# Patient Record
Sex: Female | Born: 1960 | Race: White | Hispanic: Yes | Marital: Single | Smoking: Never smoker
Health system: Southern US, Community
[De-identification: ages and names within clinical notes are randomized; demographics above are authoritative.]

## PROBLEM LIST (undated history)

## (undated) ENCOUNTER — Inpatient Hospital Stay (HOSPITAL_COMMUNITY): Payer: BLUE CROSS/BLUE SHIELD

## (undated) DIAGNOSIS — Z789 Other specified health status: Secondary | ICD-10-CM

## (undated) DIAGNOSIS — R51 Headache: Secondary | ICD-10-CM

## (undated) DIAGNOSIS — R233 Spontaneous ecchymoses: Secondary | ICD-10-CM

## (undated) DIAGNOSIS — E079 Disorder of thyroid, unspecified: Secondary | ICD-10-CM

## (undated) DIAGNOSIS — E039 Hypothyroidism, unspecified: Secondary | ICD-10-CM

## (undated) DIAGNOSIS — T4145XA Adverse effect of unspecified anesthetic, initial encounter: Secondary | ICD-10-CM

## (undated) DIAGNOSIS — R238 Other skin changes: Secondary | ICD-10-CM

## (undated) HISTORY — DX: Disorder of thyroid, unspecified: E07.9

---

## 1995-02-02 DIAGNOSIS — T8859XA Other complications of anesthesia, initial encounter: Secondary | ICD-10-CM

## 1995-02-02 HISTORY — DX: Other complications of anesthesia, initial encounter: T88.59XA

## 2001-10-10 ENCOUNTER — Encounter: Payer: Self-pay | Admitting: Family Medicine

## 2001-10-10 ENCOUNTER — Encounter: Admission: RE | Admit: 2001-10-10 | Discharge: 2001-10-10 | Payer: Self-pay | Admitting: Family Medicine

## 2003-06-14 ENCOUNTER — Inpatient Hospital Stay (HOSPITAL_COMMUNITY): Admission: AD | Admit: 2003-06-14 | Discharge: 2003-06-14 | Payer: Self-pay | Admitting: *Deleted

## 2003-06-27 ENCOUNTER — Encounter: Admission: RE | Admit: 2003-06-27 | Discharge: 2003-06-27 | Payer: Self-pay | Admitting: Obstetrics and Gynecology

## 2003-09-26 ENCOUNTER — Encounter: Admission: RE | Admit: 2003-09-26 | Discharge: 2003-09-26 | Payer: Self-pay | Admitting: Obstetrics and Gynecology

## 2003-10-01 ENCOUNTER — Ambulatory Visit (HOSPITAL_COMMUNITY): Admission: RE | Admit: 2003-10-01 | Discharge: 2003-10-01 | Payer: Self-pay | Admitting: *Deleted

## 2004-10-01 ENCOUNTER — Ambulatory Visit (HOSPITAL_COMMUNITY): Admission: RE | Admit: 2004-10-01 | Discharge: 2004-10-01 | Payer: Self-pay | Admitting: *Deleted

## 2004-12-17 ENCOUNTER — Ambulatory Visit: Payer: Self-pay | Admitting: *Deleted

## 2004-12-17 ENCOUNTER — Encounter (INDEPENDENT_AMBULATORY_CARE_PROVIDER_SITE_OTHER): Payer: Self-pay | Admitting: *Deleted

## 2005-06-01 ENCOUNTER — Emergency Department (HOSPITAL_COMMUNITY): Admission: EM | Admit: 2005-06-01 | Discharge: 2005-06-01 | Payer: Self-pay | Admitting: Emergency Medicine

## 2005-06-17 ENCOUNTER — Ambulatory Visit: Payer: Self-pay | Admitting: Nurse Practitioner

## 2005-06-17 ENCOUNTER — Ambulatory Visit: Payer: Self-pay | Admitting: *Deleted

## 2005-07-08 ENCOUNTER — Ambulatory Visit: Payer: Self-pay | Admitting: Nurse Practitioner

## 2005-08-02 ENCOUNTER — Ambulatory Visit: Payer: Self-pay | Admitting: Nurse Practitioner

## 2005-09-27 ENCOUNTER — Ambulatory Visit: Payer: Self-pay | Admitting: Family Medicine

## 2005-10-05 ENCOUNTER — Ambulatory Visit (HOSPITAL_COMMUNITY): Admission: RE | Admit: 2005-10-05 | Discharge: 2005-10-05 | Payer: Self-pay | Admitting: Obstetrics & Gynecology

## 2005-10-05 ENCOUNTER — Ambulatory Visit: Payer: Self-pay | Admitting: Internal Medicine

## 2005-11-03 ENCOUNTER — Ambulatory Visit: Payer: Self-pay | Admitting: Nurse Practitioner

## 2005-11-11 ENCOUNTER — Ambulatory Visit: Payer: Self-pay | Admitting: Nurse Practitioner

## 2005-12-13 ENCOUNTER — Ambulatory Visit: Payer: Self-pay | Admitting: Nurse Practitioner

## 2006-01-14 ENCOUNTER — Ambulatory Visit: Payer: Self-pay | Admitting: Nurse Practitioner

## 2006-01-14 ENCOUNTER — Encounter (INDEPENDENT_AMBULATORY_CARE_PROVIDER_SITE_OTHER): Payer: Self-pay | Admitting: Nurse Practitioner

## 2006-03-07 ENCOUNTER — Ambulatory Visit: Payer: Self-pay | Admitting: Internal Medicine

## 2006-06-09 ENCOUNTER — Ambulatory Visit: Payer: Self-pay | Admitting: Nurse Practitioner

## 2006-06-20 ENCOUNTER — Ambulatory Visit: Payer: Self-pay | Admitting: Nurse Practitioner

## 2006-06-28 ENCOUNTER — Ambulatory Visit: Payer: Self-pay | Admitting: Nurse Practitioner

## 2006-07-05 ENCOUNTER — Ambulatory Visit: Payer: Self-pay | Admitting: Internal Medicine

## 2006-09-07 ENCOUNTER — Ambulatory Visit: Payer: Self-pay | Admitting: Internal Medicine

## 2006-09-14 ENCOUNTER — Ambulatory Visit (HOSPITAL_COMMUNITY): Admission: RE | Admit: 2006-09-14 | Discharge: 2006-09-14 | Payer: Self-pay | Admitting: Nurse Practitioner

## 2006-10-07 ENCOUNTER — Ambulatory Visit (HOSPITAL_COMMUNITY): Admission: RE | Admit: 2006-10-07 | Discharge: 2006-10-07 | Payer: Self-pay | Admitting: Family Medicine

## 2006-10-19 ENCOUNTER — Encounter (INDEPENDENT_AMBULATORY_CARE_PROVIDER_SITE_OTHER): Payer: Self-pay | Admitting: *Deleted

## 2006-11-04 ENCOUNTER — Ambulatory Visit: Payer: Self-pay | Admitting: Internal Medicine

## 2006-11-04 ENCOUNTER — Encounter (INDEPENDENT_AMBULATORY_CARE_PROVIDER_SITE_OTHER): Payer: Self-pay | Admitting: Nurse Practitioner

## 2006-11-04 LAB — CONVERTED CEMR LAB: TSH: 2.23 microintl units/mL (ref 0.350–5.50)

## 2007-07-18 ENCOUNTER — Encounter (INDEPENDENT_AMBULATORY_CARE_PROVIDER_SITE_OTHER): Payer: Self-pay | Admitting: Nurse Practitioner

## 2007-07-18 LAB — CONVERTED CEMR LAB
BUN: 18 mg/dL (ref 6–23)
Calcium: 9.2 mg/dL (ref 8.4–10.5)
HDL: 46 mg/dL (ref >39)
Sodium: 143 meq/L (ref 135–145)
TSH: 5.195 microintl units/mL (ref 0.350–5.50)
Triglycerides: 142 mg/dL (ref <150)
VLDL: 28 mg/dL (ref 0–40)

## 2007-07-19 ENCOUNTER — Ambulatory Visit: Payer: Self-pay | Admitting: Internal Medicine

## 2007-10-10 ENCOUNTER — Ambulatory Visit (HOSPITAL_COMMUNITY): Admission: RE | Admit: 2007-10-10 | Discharge: 2007-10-10 | Payer: Self-pay | Admitting: Family Medicine

## 2007-11-22 ENCOUNTER — Ambulatory Visit: Payer: Self-pay | Admitting: Internal Medicine

## 2007-11-22 ENCOUNTER — Encounter (INDEPENDENT_AMBULATORY_CARE_PROVIDER_SITE_OTHER): Payer: Self-pay | Admitting: Internal Medicine

## 2007-11-22 LAB — CONVERTED CEMR LAB
ALT: 15 units/L (ref 0–35)
AST: 24 units/L (ref 0–37)
Alkaline Phosphatase: 83 units/L (ref 39–117)
Creatinine, Ser: 0.69 mg/dL (ref 0.40–1.20)
Eosinophils Relative: 2 % (ref 0–5)
HCT: 42.2 % (ref 36.0–46.0)
HDL: 46 mg/dL (ref >39)
LDL Cholesterol: 121 mg/dL — ABNORMAL HIGH (ref 0–99)
MCV: 87.7 fL (ref 78.0–100.0)
Monocytes Relative: 6 % (ref 3–12)
Platelets: 236 10*3/uL (ref 150–400)
Potassium: 4.3 meq/L (ref 3.5–5.3)
RBC: 4.81 M/uL (ref 3.87–5.11)
RDW: 13.7 % (ref 11.5–15.5)
Sodium: 141 meq/L (ref 135–145)
Total Bilirubin: 0.6 mg/dL (ref 0.3–1.2)
Total CHOL/HDL Ratio: 4.4
VLDL: 34 mg/dL (ref 0–40)

## 2007-12-18 ENCOUNTER — Encounter (INDEPENDENT_AMBULATORY_CARE_PROVIDER_SITE_OTHER): Payer: Self-pay | Admitting: Internal Medicine

## 2007-12-18 ENCOUNTER — Ambulatory Visit: Payer: Self-pay | Admitting: Internal Medicine

## 2007-12-18 LAB — CONVERTED CEMR LAB: GC Probe Amp, Genital: NEGATIVE

## 2007-12-19 ENCOUNTER — Encounter (INDEPENDENT_AMBULATORY_CARE_PROVIDER_SITE_OTHER): Payer: Self-pay | Admitting: Internal Medicine

## 2008-02-16 ENCOUNTER — Ambulatory Visit: Payer: Self-pay | Admitting: Internal Medicine

## 2008-07-18 ENCOUNTER — Ambulatory Visit: Payer: Self-pay | Admitting: Internal Medicine

## 2008-09-19 ENCOUNTER — Encounter (INDEPENDENT_AMBULATORY_CARE_PROVIDER_SITE_OTHER): Payer: Self-pay | Admitting: Internal Medicine

## 2008-09-19 ENCOUNTER — Ambulatory Visit: Payer: Self-pay | Admitting: Internal Medicine

## 2008-10-10 ENCOUNTER — Ambulatory Visit (HOSPITAL_COMMUNITY): Admission: RE | Admit: 2008-10-10 | Discharge: 2008-10-10 | Payer: Self-pay | Admitting: Family Medicine

## 2009-02-12 ENCOUNTER — Encounter (INDEPENDENT_AMBULATORY_CARE_PROVIDER_SITE_OTHER): Payer: Self-pay | Admitting: Family Medicine

## 2009-02-12 ENCOUNTER — Ambulatory Visit: Payer: Self-pay | Admitting: Internal Medicine

## 2009-02-12 LAB — CONVERTED CEMR LAB
AST: 16 units/L (ref 0–37)
Albumin: 4.3 g/dL (ref 3.5–5.2)
BUN: 12 mg/dL (ref 6–23)
Basophils Relative: 0 % (ref 0–1)
Cholesterol: 173 mg/dL (ref 0–200)
Eosinophils Absolute: 0.2 10*3/uL (ref 0.0–0.7)
Glucose, Bld: 95 mg/dL (ref 70–99)
HCT: 42.5 % (ref 36.0–46.0)
HDL: 45 mg/dL (ref >39)
LDL Cholesterol: 96 mg/dL (ref 0–99)
Lymphocytes Relative: 47 % — ABNORMAL HIGH (ref 12–46)
Lymphs Abs: 3.3 10*3/uL (ref 0.7–4.0)
MCV: 88.2 fL (ref 78.0–100.0)
Monocytes Absolute: 0.4 10*3/uL (ref 0.1–1.0)
Neutrophils Relative %: 44 % (ref 43–77)
Potassium: 3.8 meq/L (ref 3.5–5.3)
RBC: 4.82 M/uL (ref 3.87–5.11)
Sodium: 139 meq/L (ref 135–145)
Total Protein: 7.1 g/dL (ref 6.0–8.3)

## 2009-02-13 ENCOUNTER — Ambulatory Visit (HOSPITAL_COMMUNITY): Admission: RE | Admit: 2009-02-13 | Discharge: 2009-02-13 | Payer: Self-pay | Admitting: Family Medicine

## 2009-02-26 ENCOUNTER — Ambulatory Visit: Payer: Self-pay | Admitting: Internal Medicine

## 2009-02-26 ENCOUNTER — Encounter (INDEPENDENT_AMBULATORY_CARE_PROVIDER_SITE_OTHER): Payer: Self-pay | Admitting: Family Medicine

## 2009-02-26 LAB — CONVERTED CEMR LAB
Hgb A1c MFr Bld: 6.3 % — ABNORMAL HIGH (ref 4.6–6.1)
TSH: 2.135 microintl units/mL (ref 0.350–4.500)

## 2009-03-03 ENCOUNTER — Ambulatory Visit (HOSPITAL_COMMUNITY): Admission: RE | Admit: 2009-03-03 | Discharge: 2009-03-03 | Payer: Self-pay | Admitting: Family Medicine

## 2009-04-17 ENCOUNTER — Ambulatory Visit: Payer: Self-pay | Admitting: Internal Medicine

## 2009-10-20 ENCOUNTER — Ambulatory Visit (HOSPITAL_COMMUNITY): Admission: RE | Admit: 2009-10-20 | Discharge: 2009-10-20 | Payer: Self-pay | Admitting: Family Medicine

## 2010-01-13 ENCOUNTER — Other Ambulatory Visit
Admission: RE | Admit: 2010-01-13 | Discharge: 2010-01-13 | Payer: Self-pay | Source: Home / Self Care | Admitting: Family Medicine

## 2010-01-13 ENCOUNTER — Encounter (INDEPENDENT_AMBULATORY_CARE_PROVIDER_SITE_OTHER): Payer: Self-pay | Admitting: *Deleted

## 2010-02-22 ENCOUNTER — Encounter: Payer: Self-pay | Admitting: *Deleted

## 2010-06-19 NOTE — Group Therapy Note (Signed)
NAME:  Audrey, Schwartz NO.:  [id number]   MEDICAL RECORD NO.:  [id number]                   PATIENT TYPE:  OUT   LOCATION:  [facility]                           FACILITY:  [facility]   PHYSICIAN:  Argentina Donovan, MD                     DATE OF BIRTH:  [birth year]   DATE OF SERVICE:  09/26/2003                                    CLINIC NOTE   REASON FOR VISIT:  The patient is a 50 year old Hispanic, non-English-  speaking gravida 5 para 4-0-1-4 who had a set of twins by cesarean section -  one twin did not survive; all other deliveries have been vaginal.  She was  seen in May in the MAU because of severe low abdominal pain and inability to  void.  At that point I found no reasons for symptoms.  We placed her on a  period chart.  Since that time her periods have been normal.  Her only real  complaint is severe pain on the last day of her period, which only lasts 3  days - the period only lasts 3 days and the pain is only 1 day.  We had  ordered a pelvic sonogram which the patient did not get, although she had a  normal mammogram.  If the sonogram is normal, I would suggest we try the  patient on oral contraceptives to control the pain that she does have the  one day if she desires, since she is a nonsmoker.   IMPRESSION:  Severe dysmenorrhea, 1 day each month.                                               Argentina Donovan, MD    PR/MEDQ  D:  09/26/2003  T:  09/26/2003  Job:  [id number]

## 2010-06-19 NOTE — Group Therapy Note (Signed)
NAMEMarland Kitchen  Audrey Schwartz, Audrey Schwartz NO.:  [id number]   MEDICAL RECORD NO.:  [id number]                   PATIENT TYPE:  OUT   LOCATION[facility]  [facility]                           FACILITY:  [facility]   PHYSICIAN:  Argentina Donovan, MD                     DATE OF BIRTH:  [birth year]   DATE OF SERVICE:  06/27/2003                                    CLINIC NOTE   REASON FOR VISIT:  The patient is a 50 year old Hispanic female, non-English-  speaking, gravida 5 para 4-0-1-4 who did have a set of twins by cesarean  section and one twin did not survive.  All her other deliveries have been  vaginal.  Her youngest child is 7 and since her last baby she has used no  contraception although her partner is different from the father of the  babies.  Over the past 6 months she has had several episodes of right flank  pain where she said she felt a mass over there and then the inability to  void and had urinary retention.  She recently went into the MAU on Jun 14, 2003 after having had her last menstrual period on May 7 with a complaint of  abdominal pain and inability to void.  Her CBC and urine were normal; the  abdomen was nonrevealing.  Today the abdomen was soft, flat, nontender; no  masses, no organomegaly.  Pelvic examination:  External genitalia is normal.  BUS within normal limits.  The vagina is clean and well rugated.  The cervix  is clean and parous and Pap smear was taken.  The uterus is retroverted,  upper limits of normal in size and somewhat irregular in configuration.  The  adnexa could not be well outlined.  I see no reason right now for her  symptoms so I am going to have her for the next 2 months plot her periods  and also the episodes of this pain and inability to void, and we will see  her again after she has done this for 2 cycles.  In the interim we are going  to get an ultrasound and a mammogram.   IMPRESSION:  Erratic episodes of abdominal pain, etiology unknown,  accompanied with urinary retention.                                               Argentina Donovan, MD    PR/MEDQ  D:  06/27/2003  T:  06/27/2003  Job:  [id number]

## 2010-08-27 ENCOUNTER — Encounter (HOSPITAL_COMMUNITY): Payer: Self-pay | Admitting: *Deleted

## 2010-08-27 ENCOUNTER — Inpatient Hospital Stay (HOSPITAL_COMMUNITY)
Admission: AD | Admit: 2010-08-27 | Discharge: 2010-08-27 | Disposition: A | Discharge status: Home / Self Care | Payer: Self-pay | Source: Ambulatory Visit | Attending: Obstetrics & Gynecology | Admitting: Obstetrics & Gynecology

## 2010-08-27 DIAGNOSIS — N6324 Unspecified lump in the left breast, lower inner quadrant: Secondary | ICD-10-CM

## 2010-08-27 DIAGNOSIS — N6320 Unspecified lump in the left breast, unspecified quadrant: Secondary | ICD-10-CM

## 2010-08-27 DIAGNOSIS — N644 Mastodynia: Secondary | ICD-10-CM

## 2010-08-27 DIAGNOSIS — N63 Unspecified lump in unspecified breast: Secondary | ICD-10-CM

## 2010-08-27 HISTORY — DX: Other specified health status: Z78.9

## 2010-08-27 NOTE — Progress Notes (Signed)
Itching and pain in the left breast x15 days. Pt states she feels a lump in her breast.

## 2010-08-27 NOTE — ED Provider Notes (Signed)
Audrey Schwartz is a 50 y.o. Hispanic female with c/o left breast pain.  HPI: Onset of pain one week ago She has felt a knot. Her last mammogram was one year ago here at [facility] and was normal. She has no other complaints today.  ROS: negative except as described in the HPI. Exam:  Filed Vitals:   08/27/10 1129  BP: 104/67  Pulse: 62  Temp: 98.1 F (36.7 C)  Resp: 16    Right breast - nipple normal form, non tender. Left breast - nipple inverted, tender on palpation, small (pea size) nodes palp at 7 o'clock, 9 o'clock and 12 o'clock.  Assessment - left breast tenderness, breast cysts, inverted nipple. Plan apt. With [facility].   [place], [place] 08/27/10 1224

## 2010-08-27 NOTE — Progress Notes (Signed)
[employer] translator - Pt states she is having pain in L breast for 15 days, is also having itching on the breast.

## 2010-09-01 ENCOUNTER — Ambulatory Visit
Admit: 2010-09-01 | Discharge: 2010-09-01 | Disposition: A | Discharge status: Home / Self Care | Payer: Self-pay | Attending: Obstetrics & Gynecology | Admitting: Obstetrics & Gynecology

## 2010-10-01 ENCOUNTER — Other Ambulatory Visit (HOSPITAL_COMMUNITY): Payer: Self-pay | Admitting: Family Medicine

## 2010-10-01 DIAGNOSIS — Z1231 Encounter for screening mammogram for malignant neoplasm of breast: Secondary | ICD-10-CM

## 2010-10-28 ENCOUNTER — Ambulatory Visit (HOSPITAL_COMMUNITY)
Admission: RE | Admit: 2010-10-28 | Discharge: 2010-10-28 | Disposition: A | Discharge status: Home / Self Care | Payer: Self-pay | Source: Ambulatory Visit | Attending: Family Medicine | Admitting: Family Medicine

## 2010-10-28 DIAGNOSIS — Z1231 Encounter for screening mammogram for malignant neoplasm of breast: Secondary | ICD-10-CM | POA: Insufficient documentation

## 2012-01-12 ENCOUNTER — Emergency Department (HOSPITAL_COMMUNITY)
Admission: EM | Admit: 2012-01-12 | Discharge: 2012-01-12 | Disposition: A | Discharge status: Home / Self Care | Payer: No Typology Code available for payment source | Source: Home / Self Care

## 2012-01-12 ENCOUNTER — Encounter (HOSPITAL_COMMUNITY): Payer: Self-pay

## 2012-01-12 DIAGNOSIS — E039 Hypothyroidism, unspecified: Secondary | ICD-10-CM

## 2012-01-12 DIAGNOSIS — Z760 Encounter for issue of repeat prescription: Secondary | ICD-10-CM

## 2012-01-12 LAB — CBC
HCT: 39.2 % (ref 36.0–46.0)
MCV: 83.9 fL (ref 78.0–100.0)
Platelets: 202 10*3/uL (ref 150–400)

## 2012-01-12 LAB — BASIC METABOLIC PANEL
Chloride: 106 mEq/L (ref 96–112)
GFR calc Af Amer: >90 mL/min (ref >90)
GFR calc non Af Amer: >90 mL/min (ref >90)
Glucose, Bld: 99 mg/dL (ref 70–99)
Potassium: 3.8 mEq/L (ref 3.5–5.1)

## 2012-01-12 MED ORDER — LEVOTHYROXINE SODIUM 88 MCG PO TABS: 88.0000 ug | ORAL_TABLET | Freq: Every day | ORAL | Status: DC

## 2012-01-12 NOTE — ED Notes (Signed)
[facility] client in need of medication refill

## 2012-01-12 NOTE — ED Provider Notes (Signed)
History     CSN: [id number]  Arrival date & time 01/12/12  1007      Chief Complaint  Patient presents with  . Medication Refill    (Consider location/radiation/quality/duration/timing/severity/associated sxs/prior treatment) HPI  51 year old Hispanic female with history of hypothyroidism comes in for medication refill for Synthroid. Patient takes 71 MCG of Synthroid daily for past 4 years and needs refills today. He has been taking it without interruption. She denies any fever, chills, headache, blurry vision, chest pain, palpitations, shortness of breath, abdominal pain, bowel or urinary symptoms. She denies having menstrual cycles at this time. She does inform feeling fatigued off and on. Denies any change in her weight. She denies any body aches or joint pains.   Past Medical History  Diagnosis Date  . No pertinent past medical history     PT IS A POOR HISTORIAN     Past Surgical History  Procedure Date  . Cesarean section     No family history on file.  History  Substance Use Topics  . Smoking status: Never Smoker   . Smokeless tobacco: Not on file  . Alcohol Use: No    OB History    Grav Para Term Preterm Abortions TAB SAB Ect Mult Living   8 7 6 1 1  1   6       Review of Systems  Allergies  Review of patient's allergies indicates no known allergies.  Home Medications   Current Outpatient Rx  Name  Route  Sig  Dispense  Refill  . LEVOTHYROXINE SODIUM 88 MCG PO TABS   Oral   Take 1 tablet (88 mcg total) by mouth daily.   30 tablet   5     BP 101/79  Pulse 60  Temp 97.5 F (36.4 C) (Oral)  Resp 20  SpO2 100%  Physical Exam Middle aged female in no acute distress HEENT: No pallor no icterus moist oral mucosa CVS: Normal S1 and S2 no murmurs Chest: Clear to auscultation Extremities: No edema ED Course  Procedures (including critical care time)   Labs Reviewed  TSH  CBC  BASIC METABOLIC PANEL   No results found.   1.  Hypothyroidism   2. Prescription refill    Will prescribe Synthroid 88 MCG by mouth daily with 5 refills. I'll check CBC, BMX and TSH level and adjust dose of Synthroid if levels therapeutic. Patient has had a past done within 2 years and a mammogram done 1 year back. Pulse scheduled for a repeat mammogram on her next visit 6 consider. She has not had a colonoscopy done and is not interested at this time. We will try to schedule it  during her next visit.    MDM          Eddie North, MD 01/12/12 1044

## 2012-01-13 ENCOUNTER — Other Ambulatory Visit (HOSPITAL_COMMUNITY): Payer: Self-pay | Admitting: Internal Medicine

## 2012-01-13 DIAGNOSIS — Z1231 Encounter for screening mammogram for malignant neoplasm of breast: Secondary | ICD-10-CM

## 2012-01-13 DIAGNOSIS — Z0000 Encounter for general adult medical examination without abnormal findings: Secondary | ICD-10-CM

## 2012-02-03 ENCOUNTER — Ambulatory Visit (HOSPITAL_COMMUNITY)
Admission: RE | Admit: 2012-02-03 | Discharge: 2012-02-03 | Disposition: A | Discharge status: Home / Self Care | Payer: No Typology Code available for payment source | Source: Ambulatory Visit | Attending: Internal Medicine | Admitting: Internal Medicine

## 2012-02-03 DIAGNOSIS — Z0000 Encounter for general adult medical examination without abnormal findings: Secondary | ICD-10-CM

## 2012-02-03 DIAGNOSIS — Z1231 Encounter for screening mammogram for malignant neoplasm of breast: Secondary | ICD-10-CM | POA: Insufficient documentation

## 2012-04-29 ENCOUNTER — Emergency Department (HOSPITAL_COMMUNITY)
Admission: EM | Admit: 2012-04-29 | Discharge: 2012-04-29 | Disposition: A | Discharge status: Home / Self Care | Payer: No Typology Code available for payment source | Attending: Emergency Medicine | Admitting: Emergency Medicine

## 2012-04-29 ENCOUNTER — Encounter (HOSPITAL_COMMUNITY): Payer: Self-pay | Admitting: Emergency Medicine

## 2012-04-29 DIAGNOSIS — Y9389 Activity, other specified: Secondary | ICD-10-CM | POA: Insufficient documentation

## 2012-04-29 DIAGNOSIS — Y92410 Unspecified street and highway as the place of occurrence of the external cause: Secondary | ICD-10-CM | POA: Insufficient documentation

## 2012-04-29 DIAGNOSIS — IMO0002 Reserved for concepts with insufficient information to code with codable children: Secondary | ICD-10-CM | POA: Insufficient documentation

## 2012-04-29 DIAGNOSIS — S39012A Strain of muscle, fascia and tendon of lower back, initial encounter: Secondary | ICD-10-CM

## 2012-04-29 DIAGNOSIS — S335XXA Sprain of ligaments of lumbar spine, initial encounter: Secondary | ICD-10-CM | POA: Insufficient documentation

## 2012-04-29 DIAGNOSIS — Z79899 Other long term (current) drug therapy: Secondary | ICD-10-CM | POA: Insufficient documentation

## 2012-04-29 MED ORDER — DIAZEPAM 5 MG PO TABS: 5.0000 mg | ORAL_TABLET | Freq: Two times a day (BID) | ORAL | Status: DC

## 2012-04-29 MED ORDER — IBUPROFEN 400 MG PO TABS
800.0000 mg | ORAL_TABLET | Freq: Once | ORAL | Status: AC
  Administered 2012-04-29: 800 mg via ORAL
  Filled 2012-04-29: qty 2

## 2012-04-29 MED ORDER — DIAZEPAM 5 MG PO TABS
5.0000 mg | ORAL_TABLET | Freq: Once | ORAL | Status: AC
  Administered 2012-04-29: 5 mg via ORAL
  Filled 2012-04-29 (×2): qty 1
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
## 2012-05-17 NOTE — ED Provider Notes (Signed)
Medical screening examination/treatment/procedure(s) were conducted as a shared visit with non-physician practitioner(s) and myself.  I personally evaluated the patient during the encounter  Brandt Loosen, MD 05/17/12 [phone]

## 2012-05-26 ENCOUNTER — Other Ambulatory Visit (HOSPITAL_COMMUNITY)
Admission: RE | Admit: 2012-05-26 | Discharge: 2012-05-26 | Disposition: A | Discharge status: Home / Self Care | Payer: No Typology Code available for payment source | Source: Ambulatory Visit | Attending: Family Medicine | Admitting: Family Medicine

## 2012-05-26 ENCOUNTER — Emergency Department (HOSPITAL_COMMUNITY)
Admission: EM | Admit: 2012-05-26 | Discharge: 2012-05-26 | Disposition: A | Discharge status: Home Health | Payer: No Typology Code available for payment source | Source: Home / Self Care | Attending: Family Medicine | Admitting: Family Medicine

## 2012-05-26 ENCOUNTER — Encounter (HOSPITAL_COMMUNITY): Payer: Self-pay | Admitting: *Deleted

## 2012-05-26 ENCOUNTER — Telehealth (HOSPITAL_COMMUNITY): Payer: Self-pay

## 2012-05-26 DIAGNOSIS — N760 Acute vaginitis: Secondary | ICD-10-CM | POA: Insufficient documentation

## 2012-05-26 DIAGNOSIS — Z113 Encounter for screening for infections with a predominantly sexual mode of transmission: Secondary | ICD-10-CM | POA: Insufficient documentation

## 2012-05-26 DIAGNOSIS — R108A3 Suprapubic tenderness: Secondary | ICD-10-CM | POA: Diagnosis present

## 2012-05-26 DIAGNOSIS — E039 Hypothyroidism, unspecified: Secondary | ICD-10-CM

## 2012-05-26 DIAGNOSIS — R319 Hematuria, unspecified: Secondary | ICD-10-CM | POA: Diagnosis present

## 2012-05-26 DIAGNOSIS — R10819 Abdominal tenderness, unspecified site: Secondary | ICD-10-CM

## 2012-05-26 LAB — POCT URINALYSIS DIP (DEVICE)
Glucose, UA: NEGATIVE mg/dL
Ketones, ur: NEGATIVE mg/dL
Protein, ur: NEGATIVE mg/dL
Specific Gravity, Urine: 1.02 (ref 1.005–1.030)

## 2012-05-26 MED ORDER — LEVOTHYROXINE SODIUM 88 MCG PO TABS: 88.0000 ug | ORAL_TABLET | Freq: Every day | ORAL | Status: DC

## 2012-05-26 NOTE — ED Notes (Signed)
Audrey Schwartz spoke with patient due to a language barrier Patient will return next week for the blood work She left before getting it done

## 2012-05-26 NOTE — ED Provider Notes (Signed)
History     CSN: [id number]  Arrival date & time 05/26/12  1001   First MD Initiated Contact with Patient 05/26/12 1020     CC:  Establishing  (Consider location/radiation/quality/duration/timing/severity/associated sxs/prior treatment) The history is provided by the patient. The history is limited by a language barrier.  Had a difficult time trying to get a history from patient.  Spent 30 mins on translator telephone line trying to get information.  Pt saying that she has been having blood in urine for 2 months.  It has been intermittent associated with pain with urination but only on occasion.  She reports occasional vaginal discharge.   No fever or chills and no N/V/D.  Pt had a normal Pap test 3 years ago.  Pt says that she is postmenopausal and has not had a period for years.  Pt says that she is being treated for hypothyroidism.  Pt says that it has been stable on her medications.  No other complaints at this time.     Past Medical History  Diagnosis Date  . No pertinent past medical history     PT IS A POOR HISTORIAN         Hypothyroidism   Past Surgical History  Procedure Laterality Date  . Cesarean section      No family history on file.  History  Substance Use Topics  . Smoking status: Never Smoker   . Smokeless tobacco: Not on file  . Alcohol Use: No    OB History   Grav Para Term Preterm Abortions TAB SAB Ect Mult Living   8 7 6 1 1  1   6       Review of Systems Constitutional: Negative.  HENT: Negative.  Respiratory: Negative.  Cardiovascular: Negative.  Gastrointestinal: Negative.  Endocrine: Negative.  Genitourinary: burning with urination and hematuria  Musculoskeletal: Negative.  Skin: Negative.  Allergic/Immunologic: Negative.  Neurological: Negative.  Hematological: Negative.  Psychiatric/Behavioral: Negative.  All other systems reviewed and are negative   Allergies  Review of patient's allergies indicates no known allergies.  Home  Medications   Current Outpatient Rx  Name  Route  Sig  Dispense  Refill  . diazepam (VALIUM) 5 MG tablet   Oral   Take 1 tablet (5 mg total) by mouth 2 (two) times daily.   10 tablet   0     BP 99/63  Pulse 67  Temp(Src) 97.2 F (36.2 C) (Oral)  Resp 18  SpO2 100%  Physical Exam Nursing note and vitals reviewed.  Constitutional: She is oriented to person, place, and time. She appears well-developed and well-nourished. No distress.  HENT:  Head: Normocephalic and atraumatic.  Eyes: Conjunctivae and EOM are normal. Pupils are equal, round, and reactive to light.  Neck: Normal range of motion. Neck supple. No JVD present. No tracheal deviation present. No thyromegaly present.  Cardiovascular: Normal rate, regular rhythm and normal heart sounds.  Pulmonary/Chest: Effort normal and breath sounds normal. No respiratory distress. She has no wheezes.  Abdominal: suprapubic tenderness to palpation, mild, Soft. Bowel sounds are normal.  Musculoskeletal: Normal range of motion. She exhibits no edema and no tenderness.  Lymphadenopathy:  She has no cervical adenopathy.  Neurological: She is alert and oriented to person, place, and time. She has normal reflexes.  Skin: Skin is warm and dry.  Psychiatric: She has a normal mood and affect. Her behavior is normal. Judgment and thought content normal.    ED Course  Procedures (including critical care  time)  Labs Reviewed - No data to display No results found.   No diagnosis found.   MDM  IMPRESSION  Hematuria, trace   Hypothyroidism   RECOMMENDATIONS / PLAN Check urinalysis today and ancillary studies for GC, Chl, trich, BV, candida Check labs today, Refill thyroid medications Culture urine today Have pt follow up in 3 weeks to recheck urine, if persistent hematuria, refer to urology for further evaluation   FOLLOW UP 3 weeks   The patient was given clear instructions to go to ER or return to medical center if symptoms  don't improve, worsen or new problems develop.  The patient verbalized understanding.  The patient was told to call to get lab results if they haven't heard anything in the next week.            Cleora Fleet, MD 05/26/12 1118

## 2012-05-26 NOTE — ED Notes (Signed)
Refill on medication thyroid problem. Burning upon urination with hematuria  Intermittently x 2 months according to patient.

## 2012-05-27 LAB — URINE CULTURE
Colony Count: NO GROWTH
Culture: NO GROWTH

## 2012-05-30 NOTE — ED Notes (Signed)
[facility] 

## 2012-06-13 ENCOUNTER — Ambulatory Visit: Payer: No Typology Code available for payment source

## 2012-06-15 ENCOUNTER — Ambulatory Visit: Payer: No Typology Code available for payment source | Attending: Family Medicine

## 2012-06-15 DIAGNOSIS — R319 Hematuria, unspecified: Secondary | ICD-10-CM

## 2012-06-16 LAB — URINALYSIS
Bilirubin Urine: NEGATIVE
Hgb urine dipstick: NEGATIVE
Ketones, ur: NEGATIVE mg/dL
Nitrite: NEGATIVE
Protein, ur: NEGATIVE mg/dL
Urobilinogen, UA: 0.2 mg/dL (ref 0.0–1.0)

## 2012-06-17 NOTE — Progress Notes (Signed)
Quick Note:  Please inform patient that her urine test came back negative.   Rodney Langton, MD, CDE, FAAFP [facility] [place], [place]   ______

## 2012-06-19 ENCOUNTER — Telehealth: Payer: Self-pay

## 2012-06-19 ENCOUNTER — Telehealth: Payer: Self-pay | Admitting: Family Medicine

## 2012-06-19 NOTE — Telephone Encounter (Signed)
I INFORM PT ABOUT HER LAB RESULTS

## 2012-06-29 ENCOUNTER — Telehealth: Payer: Self-pay | Admitting: *Deleted

## 2012-06-29 NOTE — Telephone Encounter (Signed)
06/29/12 Patient unavailable at this time. P.Salam Chesterfield,RN BSN [facility]

## 2012-07-07 ENCOUNTER — Encounter: Payer: No Typology Code available for payment source | Admitting: Obstetrics and Gynecology

## 2012-08-18 NOTE — Telephone Encounter (Signed)
Spoke with patient.

## 2012-08-24 ENCOUNTER — Telehealth: Payer: Self-pay | Admitting: Family Medicine

## 2012-08-24 NOTE — Telephone Encounter (Addendum)
Pt would like to reschedule her canceled 07/07/12 gyno referral visit, she prefers Thursday appts.  Please f/u with pt.

## 2012-08-28 NOTE — Telephone Encounter (Signed)
Lvm with a brief message for [facility] to [facility] scheduled [facility] appointment.

## 2012-10-12 ENCOUNTER — Ambulatory Visit (INDEPENDENT_AMBULATORY_CARE_PROVIDER_SITE_OTHER): Payer: No Typology Code available for payment source | Admitting: Obstetrics & Gynecology

## 2012-10-12 ENCOUNTER — Encounter: Payer: Self-pay | Admitting: Obstetrics & Gynecology

## 2012-10-12 VITALS — BP 116/70 | HR 75 | Temp 98.0°F | Wt 135.1 lb

## 2012-10-12 DIAGNOSIS — Z01419 Encounter for gynecological examination (general) (routine) without abnormal findings: Secondary | ICD-10-CM

## 2012-10-12 NOTE — Progress Notes (Signed)
Patient ID: Audrey Schwartz, female   DOB: 05/09/1960, 52 y.o.   MRN: [id number]  Chief Complaint  Patient presents with  . Gynecologic Exam    HPI Audrey Schwartz is a 52 y.o. female.  [id number] No LMP recorded. Patient is postmenopausal.  referred for pap and pelvic, last pap was normal 12/2009. Episodes of hematuria and right pelvic pain, no referral to urology. HPI  Past Medical History  Diagnosis Date  . No pertinent past medical history     PT IS A POOR HISTORIAN   . Thyroid disease     Past Surgical History  Procedure Laterality Date  . Cesarean section      History reviewed. No pertinent family history.  Social History History  Substance Use Topics  . Smoking status: Never Smoker   . Smokeless tobacco: Never Used  . Alcohol Use: No    No Known Allergies  Current Outpatient Prescriptions  Medication Sig Dispense Refill  . levothyroxine (LEVOTHROID) 88 MCG tablet Take 1 tablet (88 mcg total) by mouth daily before breakfast.  30 tablet  5   No current facility-administered medications for this visit.    Review of Systems Review of Systems  Constitutional: Negative for fever.  Gastrointestinal: Positive for constipation. Negative for nausea and anal bleeding.  Genitourinary: Positive for hematuria, vaginal pain and pelvic pain. Negative for vaginal discharge.    Blood pressure 116/70, pulse 75, temperature 98 F (36.7 C), weight 135 lb 1.6 oz (61.281 kg).  Physical Exam Physical Exam  Constitutional: She is oriented to person, place, and time. She appears well-developed. No distress.  Pulmonary/Chest: Effort normal. No respiratory distress.  Breasts without masses  Abdominal: Soft. She exhibits no mass. There is no tenderness.  Genitourinary: Uterus normal. No vaginal discharge found.  Vaginal atrophy, pap and wet prep, no mass or tenderness  Musculoskeletal: Normal range of motion.  Neurological: She is alert and oriented to person, place, and  time.  Skin: Skin is warm and dry.  Psychiatric: She has a normal mood and affect. Her behavior is normal.    Data Reviewed Office notes, pap result  Assessment    Well woman exam Hematuria and r side pain     Plan    Pap and wet prep, GC and CT.  F/U with PCP. Yearly mammogram       Audrey Schwartz 10/12/2012, 3:11 PM

## 2012-10-12 NOTE — Progress Notes (Signed)
Last pap was 4 years ago, never abnormal per patient. Last mammogram was October 2013.

## 2012-10-12 NOTE — Patient Instructions (Addendum)
Mamografa (Mammography) La mamografa es un tipo de radiografa de las mamas que se realiza para observar si hay cambios que no son normales. Este tipo de radiografa se denomina mamografa. Con este procedimiento se puede estudiar el cncer de mama, detectarlo de manera temprana, y diagnosticar cncer.  INFORME A SU MDICO SOBRE:   Implantes mamarios.  Enfermedades, biopsias o cirugas previas de la mama.  Si est amamantando.  Medicamentos que utiliza, incluyendo vitaminas, hierbas, gotas oftlmicas, medicamentos de venta libre y cremas.  Uso de esteroides (por va oral o cremas).  Posibilidad de embarazo, si correspondiera. RIESGOS Y COMPLICACIONES   Exposicin a la radiacin, pero a niveles muy bajos.  Los resultados pueden estar mal interpretados.  Los resultados pueden no ser precisos.  La mamografa puede conducir a pruebas adicionales.  Puede ser que no detecte ciertos tipos de cncer. ANTES DEL PROCEDIMIENTO  Programe su prueba para aproximadamente 7 das despus de tener su perodo menstrual. En este momento las mamas estn menos sensibles y hay signos de cambios hormonales.  Si usted se ha hecho una mamografa en un establecimiento diferente en el pasado, trate de conseguirla o que la enven al nuevo establecimiento con el fin de compararlas.  El da del examen, lave sus mamas y la zona de las axilas.  No use desodorante, perfume o talco en ningn lugar de su cuerpo.  Use prendas que pueda ponerse y sacarse fcilmente. PROCEDIMIENTO Durante el procedimiento reljese tanto como le sea posible. Cualquier molestia durante la prueba ser muy leve. La ecografa demora menos de 30 minutos. Esto ocurrir:   Tendr que desvestirse de la cintura para arriba y ponerse una bata de hospital.  Se pondr de pie delante de la mquina de rayos-X.  Se coloca cada mama entre dos placas de vidrio o plstico. Las placas comprimirn los senos durante unos segundos.  Se tomar  una radiografa en diferentes ngulos de la mama. . DESPUS DEL PROCEDIMIENTO  La mamografa ser examinada.  Dependiendo de la calidad de las imgenes, es posible que tenga que repetir ciertas partes de la prueba.  Consulte con su mdico la fecha en que los resultados estarn disponibles. Asegrese de obtener los resultados.  Puede retomar sus actividades habituales. Document Released: 10/28/2004 Document Revised: 04/12/2011 [employer] Patient Information 2014 [employer].  

## 2012-10-13 LAB — WET PREP, GENITAL
Clue Cells Wet Prep HPF POC: NONE SEEN
WBC, Wet Prep HPF POC: NONE SEEN

## 2012-12-01 ENCOUNTER — Ambulatory Visit: Payer: No Typology Code available for payment source | Attending: Internal Medicine | Admitting: Internal Medicine

## 2012-12-01 VITALS — BP 115/81 | HR 59 | Temp 98.0°F | Resp 16 | Ht 63.0 in | Wt 137.0 lb

## 2012-12-01 DIAGNOSIS — Z0000 Encounter for general adult medical examination without abnormal findings: Secondary | ICD-10-CM

## 2012-12-01 DIAGNOSIS — E039 Hypothyroidism, unspecified: Secondary | ICD-10-CM

## 2012-12-01 DIAGNOSIS — Z23 Encounter for immunization: Secondary | ICD-10-CM

## 2012-12-01 DIAGNOSIS — L259 Unspecified contact dermatitis, unspecified cause: Secondary | ICD-10-CM

## 2012-12-01 LAB — CBC WITH DIFFERENTIAL/PLATELET
Basophils Relative: 0 % (ref 0–1)
Hemoglobin: 14.3 g/dL (ref 12.0–15.0)
Lymphocytes Relative: 37 % (ref 12–46)
Lymphs Abs: 2.7 10*3/uL (ref 0.7–4.0)
MCHC: 35.9 g/dL (ref 30.0–36.0)
Monocytes Relative: 6 % (ref 3–12)
Neutro Abs: 4 10*3/uL (ref 1.7–7.7)
Neutrophils Relative %: 56 % (ref 43–77)
RBC: 4.84 MIL/uL (ref 3.87–5.11)
WBC: 7.2 10*3/uL (ref 4.0–10.5)

## 2012-12-01 LAB — CMP AND LIVER
Albumin: 4.4 g/dL (ref 3.5–5.2)
Alkaline Phosphatase: 79 U/L (ref 39–117)
Chloride: 102 mEq/L (ref 96–112)
Glucose, Bld: 101 mg/dL — ABNORMAL HIGH (ref 70–99)
Indirect Bilirubin: 0.5 mg/dL (ref 0.0–0.9)
Potassium: 4.2 mEq/L (ref 3.5–5.3)
Sodium: 137 mEq/L (ref 135–145)
Total Protein: 7.4 g/dL (ref 6.0–8.3)

## 2012-12-01 LAB — TSH: TSH: 2.698 u[IU]/mL (ref 0.350–4.500)

## 2012-12-01 MED ORDER — HYDROCORTISONE 0.5 % EX CREA: TOPICAL_CREAM | Freq: Two times a day (BID) | CUTANEOUS | Status: DC

## 2012-12-01 MED ORDER — LEVOTHYROXINE SODIUM 88 MCG PO TABS: 88.0000 ug | ORAL_TABLET | Freq: Every day | ORAL | Status: DC

## 2012-12-01 NOTE — Progress Notes (Signed)
Pt is here today to establish care. Pt has hypothyroidism here today to manage her care. Pt is requesting a flu shot. Pt reports that she sometimes has pain in her left breast and lately is constantly itchy.

## 2012-12-01 NOTE — Patient Instructions (Signed)
Hipotiroidismo  (Hypothyroidism)  La tiroides es una glándula grande ubicada en la parte anterior e inferior del cuello. La glándula tiroides interviene en el control del metabolismo. El metabolismo es el modo en que el organismo utiliza los alimentos. El control del metabolismo se realiza a través de una hormona denominada tiroxina. Cuando la actividad de esta glándula está por debajo de lo normal (hipotiroidismo) produce muy poca cantidad de hormona.  CAUSAS  Aquí se incluyen:   · Ausencia de tejido tiroideo.  · Bocio por déficit de yodo.  · Bocio por medicamentos.  · Defectos congénitos (desde el nacimiento).  · Trastornos de la glándula pituitaria Esto ocasiona la falta de TSH (sigla que significa hormona estimulante de la tiroides) Esta hormona le informa a la tiroides que debe producir más hormona.  SÍNTOMAS  · Letargia (sentir que no se tiene energía)  · Intolerancia al frío  · Aumento de peso (a pesar de una ingesta normal de alimentos)  · Piel seca  · Cabello seco  · Irregularidades menstruales  · Enlentecimiento de los procesos de pensamiento  La insuficiente cantidad de hormona tiroidea también puede ocasionar problemas cardíacos. El hipotiroidismo en el recién nacido es el cretinismo en su forma extrema. Es importante que esta forma se trate de modo adecuado e inmediato, ya que puede conducir rápidamente al retardo del desarrollo físico y mental.  DIAGNÓSTICO  Para comprobar la existencia de hipotiroidismo, el profesional le solicitará análisis de sangre y radiografías y estudios con ultrasonido. Muchas veces los signos están ocultos. Es necesario que el profesional vigile la enfermedad con análisis de sangre. Esto se realiza luego de establecer un diagnóstico (determinar cuál es el problema). Puede ser necesario que el profesional que lo asiste controle esta enfermedad con análisis de sangre ya sea antes o después del diagnóstico y el tratamiento.  TRATAMIENTO  Los niveles bajos de hormona tiroidea se  incrementan con el uso de hormona tiroidea sintética. Este es un tratamiento seguro y efectivo. Se dispone de hormona tiroidea sintética para el tratamiento de este trastorno. Generalmente lleva algunas semanas obtener el efecto total de los medicamentos. Luego de obtener el efecto completo del medicamento, habitualmente pasan otras cuatro semanas para que los síntomas empiezan a desaparecer. El profesional podrá comenzar indicándole dosis bajas. Si usted tuvo problemas cardíacos, la dosis se aumentará de manera gradual. Podrá volver a lo normal sin entrar en una situación de emergencia.  INSTRUCCIONES PARA EL CUIDADO DOMICILIARIO  · Tome los medicamentos como le ha indicado el profesional que lo asiste. Infórmele al profesional todos los medicamentos que toma o que ha comenzado a tomar. El profesional que lo asiste lo ayudará con los esquemas de las dosis.  · A medida que obtiene mejoría, es necesario aumentar la dosis. Será necesario realizar continuos análisis de sangre, según lo indique el profesional.  · Informe acerca de todos los efectos secundarios que sospeche que podrían deberse a los medicamentos.  SOLICITE ATENCIÓN MÉDICA SI:  Solicite atención médica si observa:  · Sudoración.  · Temblores.  · Ansiedad.  · Rápida pérdida de peso.  · Intolerancia al calor.  · Cambios emocionales.  · Diarrea.  · Debilidad.  SOLICITE ATENCIÓN MÉDICA DE INMEDIATO SI:  Presenta dolor en el pecho, una frecuencia cardíaca irregular (palpitaciones) o latidos cardíacos rápidos.  ESTÉ SEGURO QUE:   · Comprende las instrucciones para el alta médica.  · Controlará su enfermedad.  · Solicitará atención médica de inmediato según las indicaciones.  Document Released: 01/18/2005 Document Revised:   04/12/2011  [employer]® Patient Information ©2014 [employer].

## 2012-12-01 NOTE — Progress Notes (Signed)
Patient ID: Audrey Schwartz, female   DOB: Sep 14, 1960, 52 y.o.   MRN: [id number] Patient Demographics  Audrey Schwartz, is a 52 y.o. female  [id number]:[id number]  [id number]  DOB - 04-28-1960  CC:  Chief Complaint  Patient presents with  . Establish Care       HPI: Audrey Schwartz is a 52 y.o. female here today to establish medical care. Major complaint today include rash and itchiness of on the left  breast that started recently. She had mammogram done in December 2013 that was normal, no lump noted, no nipple discharge. Her medical history include hypothyroidism and prediabetes with hemoglobin A1c of 6.3. No polyuria polyphagia or polydipsia at the moment, no symptom of hypothyroidism like constipation or cold intolerance. Patient will also wants a flu shot. She does not smoke cigarette, she does not drink alcohol Patient has No headache, No chest pain, No abdominal pain - No Nausea, No new weakness tingling or numbness, No Cough - SOB.  No Known Allergies Past Medical History  Diagnosis Date  . No pertinent past medical history     PT IS A POOR HISTORIAN   . Thyroid disease    No current outpatient prescriptions on file prior to visit.   No current facility-administered medications on file prior to visit.   History reviewed. No pertinent family history. History   Social History  . Marital Status: Single    Spouse Name: N/A    Number of Children: N/A  . Years of Education: N/A   Occupational History  . Not on file.   Social History Main Topics  . Smoking status: Never Smoker   . Smokeless tobacco: Never Used  . Alcohol Use: No  . Drug Use: No  . Sexual Activity: No   Other Topics Concern  . Not on file   Social History Narrative  . No narrative on file    Review of Systems: Constitutional: Negative for fever, chills, diaphoresis, activity change, appetite change and fatigue. HENT: Negative for ear pain, nosebleeds, congestion, facial swelling,  rhinorrhea, neck pain, neck stiffness and ear discharge.  Eyes: Negative for pain, discharge, redness, itching and visual disturbance. Respiratory: Negative for cough, choking, chest tightness, shortness of breath, wheezing and stridor.  Cardiovascular: Negative for chest pain, palpitations and leg swelling. Gastrointestinal: Negative for abdominal distention. Genitourinary: Negative for dysuria, urgency, frequency, hematuria, flank pain, decreased urine volume, difficulty urinating and dyspareunia.  Musculoskeletal: Negative for back pain, joint swelling, arthralgia and gait problem. Neurological: Negative for dizziness, tremors, seizures, syncope, facial asymmetry, speech difficulty, weakness, light-headedness, numbness and headaches.  Hematological: Negative for adenopathy. Does not bruise/bleed easily. Psychiatric/Behavioral: Negative for hallucinations, behavioral problems, confusion, dysphoric mood, decreased concentration and agitation.    Objective:   Filed Vitals:   12/01/12 1015  BP: 115/81  Pulse: 59  Temp: 98 F (36.7 C)  Resp: 16    Physical Exam: Constitutional: Patient appears well-developed and well-nourished. No distress. HENT: Normocephalic, atraumatic, External right and left ear normal. Oropharynx is clear and moist.  Eyes: Conjunctivae and EOM are normal. PERRLA, no scleral icterus. Neck: Normal ROM. Neck supple. No JVD. No tracheal deviation. No thyromegaly. CVS: RRR, S1/S2 +, no murmurs, no gallops, no carotid bruit. Eczematous rash around the left breast with scratch marks Pulmonary: Effort and breath sounds normal, no stridor, rhonchi, wheezes, rales.  Abdominal: Soft. BS +, no distension, tenderness, rebound or guarding.  Musculoskeletal: Normal range of motion. No edema and no tenderness.  Lymphadenopathy: No lymphadenopathy noted, cervical, inguinal  or axillary Neuro: Alert. Normal reflexes, muscle tone coordination. No cranial nerve deficit. Skin: Skin  is warm and dry. No rash noted. Not diaphoretic. No erythema. No pallor. Psychiatric: Normal mood and affect. Behavior, judgment, thought content normal.  Lab Results  Component Value Date   WBC 6.2 01/12/2012   HGB 13.6 01/12/2012   HCT 39.2 01/12/2012   MCV 83.9 01/12/2012   PLT 202 01/12/2012   Lab Results  Component Value Date   CREATININE 0.63 01/12/2012   BUN 13 01/12/2012   NA 141 01/12/2012   K 3.8 01/12/2012   CL 106 01/12/2012   CO2 26 01/12/2012    Lab Results  Component Value Date   HGBA1C 6.3* 02/26/2009   Lipid Panel     Component Value Date/Time   CHOL 173 02/12/2009 2031   TRIG 160* 02/12/2009 2031   HDL 45 02/12/2009 2031   CHOLHDL 3.8 Ratio 02/12/2009 2031   VLDL 32 02/12/2009 2031   LDLCALC 96 02/12/2009 2031       Assessment and plan:   Patient Active Problem List   Diagnosis Date Noted  . Unspecified hypothyroidism 12/01/2012  . Annual physical exam 12/01/2012  . Contact dermatitis 12/01/2012  . Hypothyroidism 05/26/2012  . Hematuria 05/26/2012  . Suprapubic tenderness 05/26/2012    Plan: Hydrocortisone cream 0.5% local application to left breast twice a day Levothyroxin 88 mcg tablet by mouth daily  Labs: Comprehensive metabolic panel Complete blood count and differentials Lipid panel Complete urinalysis Thyroid function test  We will call patient with results of above laboratory tests Patient has been extensively counseled about nutrition and exercise Patient encouraged to be compliant with medications      Health Maintenance -Pap Smear: Done recently normal -Mammogram: Will schedule -Vaccinations:             -Influenza given today  Follow up in 3 months when necessary  Interpreter was used to communicate directly with patient for the entire encounter including providing detailed patient instructions.   The patient was given clear instructions to go to ER or return to medical center if symptoms don't improve, worsen or new  problems develop. The patient verbalized understanding. The patient was told to call to get lab results if they haven't heard anything in the next week.     Jeanann Lewandowsky, MD, MHA, FACP, FAAP [facility] [place], [place] [phone]   12/01/2012, 12:05 PM

## 2012-12-02 LAB — URINALYSIS, COMPLETE
Bacteria, UA: NONE SEEN
Casts: NONE SEEN
Glucose, UA: NEGATIVE mg/dL
Hgb urine dipstick: NEGATIVE
Ketones, ur: NEGATIVE mg/dL
Nitrite: NEGATIVE
pH: 6 (ref 5.0–8.0)

## 2012-12-08 ENCOUNTER — Telehealth: Payer: Self-pay

## 2012-12-08 NOTE — Telephone Encounter (Signed)
Message copied by Lestine Mount on Fri Dec 08, 2012  3:12 PM ------      Message from: Quentin Angst      Created: Fri Dec 08, 2012 12:36 PM       Please inform patient that her lab results were all within normal limit ------

## 2012-12-08 NOTE — Telephone Encounter (Signed)
Can you call patient and let her know her labs were normal Thank you

## 2012-12-08 NOTE — Telephone Encounter (Signed)
LVM to patient.

## 2013-01-17 ENCOUNTER — Other Ambulatory Visit: Payer: Self-pay | Admitting: Internal Medicine

## 2013-01-17 DIAGNOSIS — Z0000 Encounter for general adult medical examination without abnormal findings: Secondary | ICD-10-CM

## 2013-02-07 ENCOUNTER — Ambulatory Visit (HOSPITAL_COMMUNITY)
Admission: RE | Admit: 2013-02-07 | Discharge: 2013-02-07 | Disposition: A | Discharge status: Home / Self Care | Payer: No Typology Code available for payment source | Source: Ambulatory Visit | Attending: Internal Medicine | Admitting: Internal Medicine

## 2013-02-07 DIAGNOSIS — Z1231 Encounter for screening mammogram for malignant neoplasm of breast: Secondary | ICD-10-CM | POA: Insufficient documentation

## 2013-02-07 DIAGNOSIS — Z0000 Encounter for general adult medical examination without abnormal findings: Secondary | ICD-10-CM

## 2013-02-22 ENCOUNTER — Ambulatory Visit: Payer: No Typology Code available for payment source

## 2013-02-22 ENCOUNTER — Telehealth: Payer: Self-pay | Admitting: Internal Medicine

## 2013-02-22 DIAGNOSIS — E039 Hypothyroidism, unspecified: Secondary | ICD-10-CM

## 2013-02-22 MED ORDER — LEVOTHYROXINE SODIUM 88 MCG PO TABS: 88.0000 ug | ORAL_TABLET | Freq: Every day | ORAL | Status: DC

## 2013-02-22 NOTE — Telephone Encounter (Signed)
Pt has appt on 03/15/13 for med refill but says she will be out of tyroid meds by then, wondering if she could receive enough refill until appt date.  Pt uses [facility] pharmacy, please f/u with pt.

## 2013-03-02 ENCOUNTER — Ambulatory Visit: Payer: No Typology Code available for payment source | Admitting: Internal Medicine

## 2013-03-15 ENCOUNTER — Encounter: Payer: Self-pay | Admitting: Internal Medicine

## 2013-03-15 ENCOUNTER — Ambulatory Visit: Payer: BC Managed Care – PPO | Attending: Internal Medicine | Admitting: Internal Medicine

## 2013-03-15 VITALS — BP 104/66 | HR 61 | Temp 97.6°F | Resp 17 | Wt 138.8 lb

## 2013-03-15 DIAGNOSIS — M25519 Pain in unspecified shoulder: Secondary | ICD-10-CM | POA: Insufficient documentation

## 2013-03-15 DIAGNOSIS — E039 Hypothyroidism, unspecified: Secondary | ICD-10-CM | POA: Insufficient documentation

## 2013-03-15 DIAGNOSIS — M25512 Pain in left shoulder: Secondary | ICD-10-CM

## 2013-03-15 DIAGNOSIS — Z1211 Encounter for screening for malignant neoplasm of colon: Secondary | ICD-10-CM

## 2013-03-15 LAB — TSH: TSH: 3.272 u[IU]/mL (ref 0.350–4.500)

## 2013-03-15 MED ORDER — NAPROXEN 500 MG PO TABS: 500.0000 mg | ORAL_TABLET | Freq: Two times a day (BID) | ORAL | Status: DC

## 2013-03-15 NOTE — Progress Notes (Signed)
MRN: [id number] Name: Audrey Schwartz  Sex: female Age: 53 y.o. DOB: March 28, 1960  Allergies: Review of patient's allergies indicates no known allergies.  Chief Complaint  Patient presents with  . Follow-up    HPI: Patient is 53 y.o. female who has history of hypothyroidism comes today for followup, she reported to have some left shoulder pain for the last few days denies any fall or trauma, she took some Tylenol which helped some, she denies any fever chills chest pain shortness of breath. She has been compliant in taking her thyroid medication. Patient is up-to-date with her mammogram but has not had colonoscopy done.   Past Medical History  Diagnosis Date  . No pertinent past medical history     PT IS A POOR HISTORIAN   . Thyroid disease     Past Surgical History  Procedure Laterality Date  . Cesarean section        Medication List       This list is accurate as of: 03/15/13  9:26 AM.  Always use your most recent med list.               hydrocortisone cream 0.5 %  Apply topically 2 (two) times daily.     levothyroxine 88 MCG tablet  Commonly known as:  SYNTHROID, LEVOTHROID  Take 1 tablet (88 mcg total) by mouth daily before breakfast.     naproxen 500 MG tablet  Commonly known as:  NAPROSYN  Take 1 tablet (500 mg total) by mouth 2 (two) times daily with a meal.        Meds ordered this encounter  Medications  . naproxen (NAPROSYN) 500 MG tablet    Sig: Take 1 tablet (500 mg total) by mouth 2 (two) times daily with a meal.    Dispense:  60 tablet    Refill:  2    Immunization History  Administered Date(s) Administered  . Influenza,inj,Quad PF,36+ Mos 12/01/2012    History reviewed. No pertinent family history.  History  Substance Use Topics  . Smoking status: Never Smoker   . Smokeless tobacco: Never Used  . Alcohol Use: No    Review of Systems   As noted in HPI  Filed Vitals:   03/15/13 0908  BP: 104/66  Pulse: 61  Temp: 97.6  F (36.4 C)  Resp: 17    Physical Exam  Physical Exam  Constitutional: No distress.  Eyes: EOM are normal. Pupils are equal, round, and reactive to light.  Cardiovascular: Normal rate and regular rhythm.   Pulmonary/Chest: Breath sounds normal. No respiratory distress. She has no wheezes. She has no rales.  Musculoskeletal:  Minimal tenderness left shoulder anteriorly, good ROM, equal strength in both upper extremities  Neurological: She has normal reflexes.    CBC    Component Value Date/Time   WBC 7.2 12/01/2012 1117   RBC 4.84 12/01/2012 1117   HGB 14.3 12/01/2012 1117   HCT 39.8 12/01/2012 1117   PLT 254 12/01/2012 1117   MCV 82.2 12/01/2012 1117   LYMPHSABS 2.7 12/01/2012 1117   MONOABS 0.5 12/01/2012 1117   EOSABS 0.1 12/01/2012 1117   BASOSABS 0.0 12/01/2012 1117    CMP     Component Value Date/Time   NA 137 12/01/2012 1117   K 4.2 12/01/2012 1117   CL 102 12/01/2012 1117   CO2 29 12/01/2012 1117   GLUCOSE 101* 12/01/2012 1117   BUN 16 12/01/2012 1117   CREATININE 0.63 12/01/2012 1117   CREATININE  0.63 01/12/2012 1028   CALCIUM 9.6 12/01/2012 1117   PROT 7.4 12/01/2012 1117   ALBUMIN 4.4 12/01/2012 1117   AST 18 12/01/2012 1117   ALT <8 12/01/2012 1117   ALKPHOS 79 12/01/2012 1117   BILITOT 0.6 12/01/2012 1117   GFRNONAA >90 01/12/2012 1028   GFRAA >90 01/12/2012 1028    Lab Results  Component Value Date/Time   CHOL 173 02/12/2009  8:31 PM    No components found with this basename: hga1c    Lab Results  Component Value Date/Time   AST 18 12/01/2012 11:17 AM    Assessment and Plan  Hypothyroidism - Plan: Repeat her TSH level, continue with current dose of levothyroxine.  Special screening for malignant neoplasms, colon - Plan: Ambulatory referral to Gastroenterology  Left shoulder pain - Plan: naproxen (NAPROSYN) 500 MG tablet  Return in about 4 months (around 07/13/2013), or if symptoms worsen or fail to improve.  Doris CheadleADVANI, Salwa Bai,  MD

## 2013-03-15 NOTE — Progress Notes (Signed)
Patient here with interpreter Here for follow up on her thyroid

## 2013-03-16 ENCOUNTER — Telehealth: Payer: Self-pay

## 2013-03-16 NOTE — Telephone Encounter (Signed)
Used the interpreter line Patient not available Message left on voice mail

## 2013-03-16 NOTE — Telephone Encounter (Signed)
Message copied by Farrell OursEVANS, Ariyon Mittleman K on Fri Mar 16, 2013  5:46 PM ------      Message from: Doris CheadleADVANI, DEEPAK      Created: Fri Mar 16, 2013  9:14 AM       Blood work reviewed, TSH level is within normal range, advised patient to continue with her current dose of levothyroxine. ------

## 2013-03-16 NOTE — Telephone Encounter (Signed)
Using interpretor line patient has been notified

## 2013-03-16 NOTE — Telephone Encounter (Signed)
Message copied by Lestine MountJUAREZ, Matie Dimaano L on Fri Mar 16, 2013 11:38 AM ------      Message from: Doris CheadleADVANI, DEEPAK      Created: Fri Mar 16, 2013  9:14 AM       Blood work reviewed, TSH level is within normal range, advised patient to continue with her current dose of levothyroxine. ------

## 2013-05-28 ENCOUNTER — Other Ambulatory Visit: Payer: Self-pay | Admitting: Gastroenterology

## 2013-07-13 ENCOUNTER — Ambulatory Visit: Payer: BC Managed Care – PPO | Admitting: Internal Medicine

## 2013-07-25 ENCOUNTER — Encounter (HOSPITAL_COMMUNITY): Payer: Self-pay | Admitting: *Deleted

## 2013-07-26 NOTE — Progress Notes (Signed)
Spoke with melanie williams office of pt inclusion arranged spanish interpreter is maria elena for procedure 08-07-13

## 2013-08-06 ENCOUNTER — Encounter (HOSPITAL_COMMUNITY): Payer: Self-pay | Admitting: Anesthesiology

## 2013-08-06 NOTE — Anesthesia Preprocedure Evaluation (Addendum)
Anesthesia Evaluation  Patient identified by MRN, date of birth, ID band Patient awake    Reviewed: Allergy & Precautions, H&P , NPO status , Patient's Chart, lab work & pertinent test results  History of Anesthesia Complications (+) history of anesthetic complications  Airway Mallampati: II TM Distance: >3 FB Neck ROM: Full    Dental no notable dental hx.    Pulmonary neg pulmonary ROS,  breath sounds clear to auscultation  Pulmonary exam normal       Cardiovascular negative cardio ROS  Rhythm:Regular Rate:Normal     Neuro/Psych  Headaches, negative psych ROS   GI/Hepatic negative GI ROS, Neg liver ROS,   Endo/Other  Hypothyroidism   Renal/GU negative Renal ROS  negative genitourinary   Musculoskeletal negative musculoskeletal ROS (+)   Abdominal   Peds negative pediatric ROS (+)  Hematology negative hematology ROS (+)   Anesthesia Other Findings   Reproductive/Obstetrics negative OB ROS                           Anesthesia Physical Anesthesia Plan  ASA: II  Anesthesia Plan: MAC   Post-op Pain Management:    Induction: Intravenous  Airway Management Planned:   Additional Equipment:   Intra-op Plan:   Post-operative Plan:   Informed Consent: I have reviewed the patients History and Physical, chart, labs and discussed the procedure including the risks, benefits and alternatives for the proposed anesthesia with the patient or authorized representative who has indicated his/her understanding and acceptance.   Dental advisory given  Plan Discussed with: CRNA  Anesthesia Plan Comments: (Discussed MAC through interpreter Tedd Sias([email or web address]). Questions answered.)       Anesthesia Quick Evaluation

## 2013-08-07 ENCOUNTER — Encounter (HOSPITAL_COMMUNITY): Payer: BC Managed Care – PPO | Admitting: Anesthesiology

## 2013-08-07 ENCOUNTER — Encounter (HOSPITAL_COMMUNITY)
Admission: RE | Disposition: A | Discharge status: Home / Self Care | Payer: BC Managed Care – PPO | Source: Ambulatory Visit | Attending: Gastroenterology

## 2013-08-07 ENCOUNTER — Ambulatory Visit (HOSPITAL_COMMUNITY)
Admission: RE | Admit: 2013-08-07 | Discharge: 2013-08-07 | Disposition: A | Discharge status: Home / Self Care | Payer: BC Managed Care – PPO | Source: Ambulatory Visit | Attending: Gastroenterology | Admitting: Gastroenterology

## 2013-08-07 ENCOUNTER — Encounter (HOSPITAL_COMMUNITY): Payer: Self-pay

## 2013-08-07 ENCOUNTER — Ambulatory Visit (HOSPITAL_COMMUNITY): Payer: BC Managed Care – PPO | Admitting: Anesthesiology

## 2013-08-07 DIAGNOSIS — Z1211 Encounter for screening for malignant neoplasm of colon: Secondary | ICD-10-CM | POA: Insufficient documentation

## 2013-08-07 DIAGNOSIS — E039 Hypothyroidism, unspecified: Secondary | ICD-10-CM | POA: Insufficient documentation

## 2013-08-07 HISTORY — DX: Adverse effect of unspecified anesthetic, initial encounter: T41.45XA

## 2013-08-07 HISTORY — DX: Other skin changes: R23.8

## 2013-08-07 HISTORY — PX: COLONOSCOPY WITH PROPOFOL: SHX5780

## 2013-08-07 HISTORY — DX: Hypothyroidism, unspecified: E03.9

## 2013-08-07 HISTORY — DX: Spontaneous ecchymoses: R23.3

## 2013-08-07 HISTORY — DX: Headache: R51

## 2013-08-07 SURGERY — COLONOSCOPY WITH PROPOFOL
Anesthesia: Monitor Anesthesia Care

## 2013-08-07 MED ORDER — PROPOFOL 10 MG/ML IV BOLUS
INTRAVENOUS | Status: AC
  Filled 2013-08-07: qty 20

## 2013-08-07 MED ORDER — PROPOFOL 10 MG/ML IV BOLUS
INTRAVENOUS | Status: DC | PRN
  Administered 2013-08-07 (×6): 50 mg via INTRAVENOUS

## 2013-08-07 MED ORDER — LACTATED RINGERS IV SOLN
INTRAVENOUS | Status: DC | PRN
  Administered 2013-08-07: 08:00:00 via INTRAVENOUS

## 2013-08-07 MED ORDER — SODIUM CHLORIDE 0.9 % IV SOLN
INTRAVENOUS | Status: DC
Start: 2013-08-07 — End: 2013-08-07

## 2013-08-07 SURGICAL SUPPLY — 21 items

## 2013-08-07 NOTE — H&P (Signed)
  Procedure: Baseline screening colonoscopy  History: The patient is a 53 year old female born 10/23/1960. She is scheduled to undergo her first screening colonoscopy with polypectomy to prevent colon cancer. There is no family history of colon cancer.  Medication allergies: None  Past medical history: Hypothyroidism. Contact dermatitis.  Habits: The patient has never smoked cigarettes. She does not consume alcohol.  Family history: Negative for colon cancer  Exam: The patient is alert and lying comfortably on the endoscopy stretcher. The interpreter is at the bedside. Cardiac exam reveals a regular rhythm. Lungs are clear to auscultation. Abdomen is soft and nontender to palpation.  Plan: Proceed with baseline screening colonoscopy

## 2013-08-07 NOTE — Anesthesia Postprocedure Evaluation (Signed)
  Anesthesia Post-op Note  Patient: Audrey ManchesterJulia Benitez-Kessinger  Procedure(s) Performed: Procedure(s) (LRB): COLONOSCOPY WITH PROPOFOL (N/A)  Patient Location: PACU  Anesthesia Type: MAC  Level of Consciousness: awake and alert   Airway and Oxygen Therapy: Patient Spontanous Breathing  Post-op Pain: mild  Post-op Assessment: Post-op Vital signs reviewed, Patient's Cardiovascular Status Stable, Respiratory Function Stable, Patent Airway and No signs of Nausea or vomiting  Last Vitals:  Filed Vitals:   08/07/13 0917  BP: 95/63  Pulse: 62  Temp:   Resp:     Post-op Vital Signs: stable   Complications: No apparent anesthesia complications

## 2013-08-07 NOTE — Op Note (Signed)
Procedure: Baseline screening colonoscopy  Endoscopist: Danise EdgeMartin Kacelyn Rowzee  Premedication: Propofol administered by anesthesia  Procedure: The patient was placed in the left lateral decubitus position. Anal inspection and digital rectal exam were normal. The Pentax pediatric colonoscope was introduced into the rectum and advanced to the cecum. A normal-appearing ileocecal valve and appendiceal orifice were identified. Colonic preparation for the exam today was good.   Rectum. Normal. Retroflexed view of the distal rectum normal  Sigmoid colon and descending colon. Normal  Splenic flexure. Normal  Transverse colon. Normal  Hepatic flexure. Normal  Ascending colon. Normal  Cecum and ileocecal valve. Normal  Assessment: Normal baseline screening colonoscopy  Recommendation: Schedule repeat screening colonoscopy in 10 years

## 2013-08-07 NOTE — Transfer of Care (Signed)
Immediate Anesthesia Transfer of Care Note  Patient: Audrey ManchesterJulia Schwartz  Procedure(s) Performed: Procedure(s): COLONOSCOPY WITH PROPOFOL (N/A)  Patient Location: PACU  Anesthesia Type:MAC  Level of Consciousness: awake, sedated and patient cooperative  Airway & Oxygen Therapy: Patient Spontanous Breathing and Patient connected to face mask oxygen  Post-op Assessment: Report given to PACU RN and Post -op Vital signs reviewed and stable  Post vital signs: Reviewed and stable  Complications: No apparent anesthesia complications

## 2013-08-08 ENCOUNTER — Encounter (HOSPITAL_COMMUNITY): Payer: Self-pay | Admitting: Gastroenterology

## 2013-10-15 ENCOUNTER — Ambulatory Visit: Payer: BC Managed Care – PPO | Attending: Internal Medicine | Admitting: Internal Medicine

## 2013-10-15 ENCOUNTER — Encounter: Payer: Self-pay | Admitting: Internal Medicine

## 2013-10-15 VITALS — BP 130/80 | HR 67 | Temp 97.7°F | Resp 16 | Wt 138.8 lb

## 2013-10-15 DIAGNOSIS — Z23 Encounter for immunization: Secondary | ICD-10-CM | POA: Diagnosis not present

## 2013-10-15 DIAGNOSIS — E039 Hypothyroidism, unspecified: Secondary | ICD-10-CM | POA: Diagnosis present

## 2013-10-15 DIAGNOSIS — Z791 Long term (current) use of non-steroidal anti-inflammatories (NSAID): Secondary | ICD-10-CM | POA: Insufficient documentation

## 2013-10-15 MED ORDER — LEVOTHYROXINE SODIUM 88 MCG PO TABS: 88.0000 ug | ORAL_TABLET | Freq: Every day | ORAL | Status: DC

## 2013-10-15 NOTE — Progress Notes (Signed)
Patient here for follow up on her thyroid disease Requesting a different medication that is a little less expensive

## 2013-10-15 NOTE — Progress Notes (Signed)
MRN: [id number] Name: Audrey Schwartz  Sex: female Age: 53 y.o. DOB: 1960-07-30  Allergies: Review of patient's allergies indicates no known allergies.  Chief Complaint  Patient presents with  . Follow-up    HPI: Patient is 53 y.o. female who has to of hypothyroidism comes today for followup, she currently taking levothyroxine 88 mcg daily, denies any acute symptoms, recently in July she had a colonoscopy done which was reported to be normal, she is up-to-date with her mammogram, she needs flu shot today.  Past Medical History  Diagnosis Date  . No pertinent past medical history     PT IS A POOR HISTORIAN   . Hypothyroidism   . Thyroid disease   . Headache(784.0)     and nausea occasionally  . Complication of anesthesia 1997    slow to awaken after c section due to dentures accidentally left in  . Bruises easily for a long time    saw doctor, no problem found    Past Surgical History  Procedure Laterality Date  . Cesarean section  1997  . Colonoscopy with propofol N/A 08/07/2013    Procedure: COLONOSCOPY WITH PROPOFOL;  Surgeon: Charolett Bumpers, MD;  Location: [facility];  Service: Endoscopy;  Laterality: N/A;      Medication List       This list is accurate as of: 10/15/13 12:23 PM.  Always use your most recent med list.               hydrocortisone cream 0.5 %  Apply topically 2 (two) times daily.     levothyroxine 88 MCG tablet  Commonly known as:  SYNTHROID, LEVOTHROID  Take 1 tablet (88 mcg total) by mouth daily before breakfast.     naproxen 500 MG tablet  Commonly known as:  NAPROSYN  Take 1 tablet (500 mg total) by mouth 2 (two) times daily with a meal.        Meds ordered this encounter  Medications  . levothyroxine (SYNTHROID, LEVOTHROID) 88 MCG tablet    Sig: Take 1 tablet (88 mcg total) by mouth daily before breakfast.    Dispense:  90 tablet    Refill:  3    Immunization History  Administered Date(s) Administered  .  Influenza,inj,Quad PF,36+ Mos 12/01/2012, 10/15/2013    History reviewed. No pertinent family history.  History  Substance Use Topics  . Smoking status: Never Smoker   . Smokeless tobacco: Never Used  . Alcohol Use: No    Review of Systems   As noted in HPI  Filed Vitals:   10/15/13 1204  BP: 130/80  Pulse: 67  Temp: 97.7 F (36.5 C)  Resp: 16    Physical Exam  Physical Exam  Constitutional: No distress.  Eyes: EOM are normal. Pupils are equal, round, and reactive to light.  Cardiovascular: Normal rate and regular rhythm.   Pulmonary/Chest: Breath sounds normal. No respiratory distress. She has no wheezes. She has no rales.  Musculoskeletal: She exhibits no edema.  Neurological: She has normal reflexes.    CBC    Component Value Date/Time   WBC 7.2 12/01/2012 1117   RBC 4.84 12/01/2012 1117   HGB 14.3 12/01/2012 1117   HCT 39.8 12/01/2012 1117   PLT 254 12/01/2012 1117   MCV 82.2 12/01/2012 1117   LYMPHSABS 2.7 12/01/2012 1117   MONOABS 0.5 12/01/2012 1117   EOSABS 0.1 12/01/2012 1117   BASOSABS 0.0 12/01/2012 1117    CMP     Component  Value Date/Time   NA 137 12/01/2012 1117   K 4.2 12/01/2012 1117   CL 102 12/01/2012 1117   CO2 29 12/01/2012 1117   GLUCOSE 101* 12/01/2012 1117   BUN 16 12/01/2012 1117   CREATININE 0.63 12/01/2012 1117   CREATININE 0.63 01/12/2012 1028   CALCIUM 9.6 12/01/2012 1117   PROT 7.4 12/01/2012 1117   ALBUMIN 4.4 12/01/2012 1117   AST 18 12/01/2012 1117   ALT <8 12/01/2012 1117   ALKPHOS 79 12/01/2012 1117   BILITOT 0.6 12/01/2012 1117   GFRNONAA >90 01/12/2012 1028   GFRAA >90 01/12/2012 1028    Lab Results  Component Value Date/Time   CHOL 173 02/12/2009  8:31 PM    No components found with this basename: hga1c    Lab Results  Component Value Date/Time   AST 18 12/01/2012 11:17 AM    Assessment and Plan  Unspecified hypothyroidism - Plan: Will repeat TSH level, continue with levothyroxine (SYNTHROID,  LEVOTHROID) 88 MCG tablet  Need for prophylactic vaccination and inoculation against influenza Flu shot given today  Health Maintenance -Colonoscopy: uptodate   -Mammogram: uptodate -Vaccinations:    -Influenza shot given today   Return in about 3 months (around 01/14/2014) for hypothyroid.  Doris Cheadle, MD

## 2013-10-16 ENCOUNTER — Telehealth: Payer: Self-pay | Admitting: *Deleted

## 2013-10-16 LAB — TSH: TSH: 1.311 u[IU]/mL (ref 0.350–4.500)

## 2013-10-16 NOTE — Telephone Encounter (Signed)
Left message to return call 

## 2013-10-17 ENCOUNTER — Telehealth: Payer: Self-pay | Admitting: *Deleted

## 2013-10-17 NOTE — Telephone Encounter (Signed)
Pt aware of lab results,and notified to continue taking Levothyroxine 88 mcg     Notes Recorded by Doris Cheadle, MD on 10/16/2013 at 2:07 PM Blood work reviewed her TSH level is in normal range, advise patient to continue with current dose of levothyroxine 88 mcg daily.

## 2013-10-18 ENCOUNTER — Telehealth: Payer: Self-pay

## 2013-10-18 NOTE — Telephone Encounter (Signed)
Message copied by Lestine Mount on Thu Oct 18, 2013  3:25 PM ------      Message from: Doris Cheadle      Created: Tue Oct 16, 2013  2:07 PM       Blood work reviewed her TSH level is in normal range, advise patient to continue with current dose of levothyroxine 88 mcg daily. ------

## 2013-10-18 NOTE — Telephone Encounter (Signed)
Interpreter line used Patient is aware of her lab results 

## 2013-12-03 ENCOUNTER — Encounter: Payer: Self-pay | Admitting: Internal Medicine

## 2014-01-07 ENCOUNTER — Ambulatory Visit: Payer: BC Managed Care – PPO | Attending: Internal Medicine | Admitting: Internal Medicine

## 2014-01-07 ENCOUNTER — Encounter: Payer: Self-pay | Admitting: Internal Medicine

## 2014-01-07 VITALS — BP 103/76 | HR 67 | Temp 98.0°F | Resp 16 | Wt 138.0 lb

## 2014-01-07 DIAGNOSIS — Z791 Long term (current) use of non-steroidal anti-inflammatories (NSAID): Secondary | ICD-10-CM | POA: Insufficient documentation

## 2014-01-07 DIAGNOSIS — E038 Other specified hypothyroidism: Secondary | ICD-10-CM

## 2014-01-07 DIAGNOSIS — L259 Unspecified contact dermatitis, unspecified cause: Secondary | ICD-10-CM | POA: Diagnosis not present

## 2014-01-07 LAB — TSH: TSH: 0.677 u[IU]/mL (ref 0.350–4.500)

## 2014-01-07 LAB — LIPID PANEL
CHOLESTEROL: 191 mg/dL (ref 0–200)
HDL: 54 mg/dL (ref >39)
LDL Cholesterol: 121 mg/dL — ABNORMAL HIGH (ref 0–99)
TRIGLYCERIDES: 82 mg/dL (ref <150)
Total CHOL/HDL Ratio: 3.5 Ratio
VLDL: 16 mg/dL (ref 0–40)

## 2014-01-07 MED ORDER — HYDROCORTISONE 0.5 % EX CREA: TOPICAL_CREAM | Freq: Two times a day (BID) | CUTANEOUS | Status: DC

## 2014-01-07 NOTE — Progress Notes (Signed)
Patient here for follow up on her thyroid disease Interpreter is in the room with patient

## 2014-01-07 NOTE — Progress Notes (Signed)
MRN: [id number] Name: Audrey ManchesterJulia Schwartz  Sex: female Age: 53 y.o. DOB: 05-08-1960  Allergies: Review of patient's allergies indicates no known allergies.  Chief Complaint  Patient presents with  . Follow-up    HPI: Patient is 53 y.o. female who history of hypothyroidism, chondrodermatitis, comes today for followup patient is requesting refill on her medication, denies any acute symptoms denies any headache dizziness chest and shortness of breath, she is up-to-date with flu shot, mammogram and colonoscopy as per patient she had done this year in 2015 July and was told colonoscopy was normal and will  repeat in 10 years.  Past Medical History  Diagnosis Date  . No pertinent past medical history     PT IS A POOR HISTORIAN   . Hypothyroidism   . Thyroid disease   . Headache(784.0)     and nausea occasionally  . Complication of anesthesia 1997    slow to awaken after c section due to dentures accidentally left in  . Bruises easily for a long time    saw doctor, no problem found    Past Surgical History  Procedure Laterality Date  . Cesarean section  1997  . Colonoscopy with propofol N/A 08/07/2013    Procedure: COLONOSCOPY WITH PROPOFOL;  Surgeon: Charolett BumpersMartin K Johnson, MD;  Location: [facility];  Service: Endoscopy;  Laterality: N/A;      Medication List       This list is accurate as of: 01/07/14 10:28 AM.  Always use your most recent med list.               hydrocortisone cream 0.5 %  Apply topically 2 (two) times daily.     levothyroxine 88 MCG tablet  Commonly known as:  SYNTHROID, LEVOTHROID  Take 1 tablet (88 mcg total) by mouth daily before breakfast.     naproxen 500 MG tablet  Commonly known as:  NAPROSYN  Take 1 tablet (500 mg total) by mouth 2 (two) times daily with a meal.        Meds ordered this encounter  Medications  . hydrocortisone cream 0.5 %    Sig: Apply topically 2 (two) times daily.    Dispense:  30 g    Refill:  0     Immunization History  Administered Date(s) Administered  . Influenza,inj,Quad PF,36+ Mos 12/01/2012, 10/15/2013    History reviewed. No pertinent family history.  History  Substance Use Topics  . Smoking status: Never Smoker   . Smokeless tobacco: Never Used  . Alcohol Use: No    Review of Systems   As noted in HPI  Filed Vitals:   01/07/14 1010  BP: 103/76  Pulse: 67  Temp: 98 F (36.7 C)  Resp: 16    Physical Exam  Physical Exam  Constitutional: No distress.  Eyes: EOM are normal. Pupils are equal, round, and reactive to light.  Cardiovascular: Normal rate.   Pulmonary/Chest: Breath sounds normal. She has no wheezes. She has no rales.  Musculoskeletal: She exhibits no edema.    CBC    Component Value Date/Time   WBC 7.2 12/01/2012 1117   RBC 4.84 12/01/2012 1117   HGB 14.3 12/01/2012 1117   HCT 39.8 12/01/2012 1117   PLT 254 12/01/2012 1117   MCV 82.2 12/01/2012 1117   LYMPHSABS 2.7 12/01/2012 1117   MONOABS 0.5 12/01/2012 1117   EOSABS 0.1 12/01/2012 1117   BASOSABS 0.0 12/01/2012 1117    CMP     Component Value Date/Time  NA 137 12/01/2012 1117   K 4.2 12/01/2012 1117   CL 102 12/01/2012 1117   CO2 29 12/01/2012 1117   GLUCOSE 101* 12/01/2012 1117   BUN 16 12/01/2012 1117   CREATININE 0.63 12/01/2012 1117   CREATININE 0.63 01/12/2012 1028   CALCIUM 9.6 12/01/2012 1117   PROT 7.4 12/01/2012 1117   ALBUMIN 4.4 12/01/2012 1117   AST 18 12/01/2012 1117   ALT <8 12/01/2012 1117   ALKPHOS 79 12/01/2012 1117   BILITOT 0.6 12/01/2012 1117   GFRNONAA >90 01/12/2012 1028   GFRAA >90 01/12/2012 1028    Lab Results  Component Value Date/Time   CHOL 173 02/12/2009 08:31 PM    No components found for: HGA1C  Lab Results  Component Value Date/Time   AST 18 12/01/2012 11:17 AM    Assessment and Plan  Other specified hypothyroidism - Plan: will repeat her TSH level currently patient is on levothyroxine 88 mcg daily., Lipid  panel  Contact dermatitis - Plan: hydrocortisone cream 0.5 %   Health Maintenance -Colonoscopy:up-to-date with colonoscopy done in 2015.  -Mammogram:up-to-date  -Vaccinations:  -up-to-date with flu shot  Return in about 4 months (around 05/09/2014).  Doris CheadleADVANI, Shandreka Dante, MD

## 2014-01-08 ENCOUNTER — Telehealth: Payer: Self-pay | Admitting: *Deleted

## 2014-01-08 NOTE — Telephone Encounter (Signed)
-----   Message from Doris Cheadleeepak Advani, MD sent at 01/08/2014  9:47 AM EST ----- Call and let the patient know that her TSH level is in normal range, advise patient to continue with current dose of levothyroxine.

## 2014-01-08 NOTE — Telephone Encounter (Signed)
Left message with lab results, advised to continue medication as prescribe If any question return call

## 2014-01-29 ENCOUNTER — Telehealth: Payer: Self-pay | Admitting: Internal Medicine

## 2014-01-29 NOTE — Telephone Encounter (Signed)
Patient is requesting a refill for levothyroxine (SYNTHROID, LEVOTHROID) 88 MCG tablet since she will be leaving out of town on jan 6th. She is hoping to get refilled for one month more to last her the leave. Please follow up with pt.

## 2014-01-30 ENCOUNTER — Telehealth: Payer: Self-pay

## 2014-01-30 ENCOUNTER — Other Ambulatory Visit: Payer: Self-pay

## 2014-01-30 DIAGNOSIS — E079 Disorder of thyroid, unspecified: Secondary | ICD-10-CM

## 2014-01-30 MED ORDER — LEVOTHYROXINE SODIUM 88 MCG PO TABS: 88.0000 ug | ORAL_TABLET | Freq: Every day | ORAL | Status: DC

## 2014-01-30 NOTE — Telephone Encounter (Signed)
Patient called requesting refill on her thyroid medication [facility]

## 2014-02-04 NOTE — Telephone Encounter (Signed)
Pt notified medication was refills to [facility] pharmacy (Information was given in spanish)

## 2014-03-11 ENCOUNTER — Other Ambulatory Visit: Payer: Self-pay | Admitting: Internal Medicine

## 2014-03-11 DIAGNOSIS — Z1231 Encounter for screening mammogram for malignant neoplasm of breast: Secondary | ICD-10-CM

## 2014-03-18 ENCOUNTER — Ambulatory Visit (HOSPITAL_COMMUNITY)
Admission: RE | Admit: 2014-03-18 | Discharge: 2014-03-18 | Disposition: A | Discharge status: Home / Self Care | Payer: 59 | Source: Ambulatory Visit | Attending: Internal Medicine | Admitting: Internal Medicine

## 2014-03-18 DIAGNOSIS — Z1231 Encounter for screening mammogram for malignant neoplasm of breast: Secondary | ICD-10-CM

## 2014-03-21 ENCOUNTER — Telehealth: Payer: Self-pay | Admitting: Internal Medicine

## 2014-11-07 ENCOUNTER — Encounter: Payer: Self-pay | Admitting: Internal Medicine

## 2014-11-07 ENCOUNTER — Ambulatory Visit: Payer: 59 | Attending: Internal Medicine | Admitting: Internal Medicine

## 2014-11-07 VITALS — BP 133/80 | HR 61 | Temp 98.0°F | Resp 16 | Ht 63.0 in | Wt 138.0 lb

## 2014-11-07 DIAGNOSIS — Z79899 Other long term (current) drug therapy: Secondary | ICD-10-CM | POA: Insufficient documentation

## 2014-11-07 DIAGNOSIS — E079 Disorder of thyroid, unspecified: Secondary | ICD-10-CM | POA: Diagnosis present

## 2014-11-07 DIAGNOSIS — R5383 Other fatigue: Secondary | ICD-10-CM | POA: Diagnosis not present

## 2014-11-07 DIAGNOSIS — Z23 Encounter for immunization: Secondary | ICD-10-CM | POA: Insufficient documentation

## 2014-11-07 LAB — CBC WITH DIFFERENTIAL/PLATELET
Basophils Absolute: 0 10*3/uL (ref 0.0–0.1)
Basophils Relative: 0 % (ref 0–1)
EOS PCT: 2 % (ref 0–5)
Eosinophils Absolute: 0.1 10*3/uL (ref 0.0–0.7)
HEMATOCRIT: 39.4 % (ref 36.0–46.0)
Hemoglobin: 13.8 g/dL (ref 12.0–15.0)
LYMPHS ABS: 3.3 10*3/uL (ref 0.7–4.0)
LYMPHS PCT: 49 % — AB (ref 12–46)
MCH: 29.7 pg (ref 26.0–34.0)
MCHC: 35 g/dL (ref 30.0–36.0)
MCV: 84.9 fL (ref 78.0–100.0)
MONO ABS: 0.4 10*3/uL (ref 0.1–1.0)
MPV: 9.4 fL (ref 8.6–12.4)
Monocytes Relative: 6 % (ref 3–12)
Neutro Abs: 2.9 10*3/uL (ref 1.7–7.7)
Neutrophils Relative %: 43 % (ref 43–77)
Platelets: 223 10*3/uL (ref 150–400)
RBC: 4.64 MIL/uL (ref 3.87–5.11)
RDW: 13.9 % (ref 11.5–15.5)
WBC: 6.8 10*3/uL (ref 4.0–10.5)

## 2014-11-07 MED ORDER — LEVOTHYROXINE SODIUM 88 MCG PO TABS: 88.0000 ug | ORAL_TABLET | Freq: Every day | ORAL | Status: DC

## 2014-11-07 NOTE — Progress Notes (Signed)
Patient ID: Audrey Schwartz, female   DOB: 1960-05-27, 54 y.o.   MRN: [id number]   CC: thyroid disease  HPI: Audrey Schwartz is a 54 y.o. female here today for a follow up visit.  Patient has past medical history of thyroid disease. Patient reports that she has not been seen since December 2015 and has not had her TSH levels checked. She has been feeling slightly more fatigued than usually and wants to make sure her levels are correct. She takes her medication daily 30 minutes before breakfast and does not skip doses. She has no other complaints today.   Patient has No headache, No chest pain, No abdominal pain - No Nausea, No new weakness tingling or numbness, No Cough - SOB.  No Known Allergies Past Medical History  Diagnosis Date  . No pertinent past medical history     PT IS A POOR HISTORIAN   . Hypothyroidism   . Thyroid disease   . Headache(784.0)     and nausea occasionally  . Complication of anesthesia 1997    slow to awaken after c section due to dentures accidentally left in  . Bruises easily for a long time    saw doctor, no problem found   Current Outpatient Prescriptions on File Prior to Visit  Medication Sig Dispense Refill  . hydrocortisone cream 0.5 % Apply topically 2 (two) times daily. 30 g 0  . naproxen (NAPROSYN) 500 MG tablet Take 1 tablet (500 mg total) by mouth 2 (two) times daily with a meal. 60 tablet 2   No current facility-administered medications on file prior to visit.   History reviewed. No pertinent family history. Social History   Social History  . Marital Status: Single    Spouse Name: N/A  . Number of Children: N/A  . Years of Education: N/A   Occupational History  . Not on file.   Social History Main Topics  . Smoking status: Never Smoker   . Smokeless tobacco: Never Used  . Alcohol Use: No  . Drug Use: No  . Sexual Activity: No   Other Topics Concern  . Not on file   Social History Narrative    Review of  Systems: Other than what is stated in HPI, all other systems are negative.   Objective:   Filed Vitals:   11/07/14 1040  BP: 133/80  Pulse: 61  Temp: 98 F (36.7 C)  Resp: 16    Physical Exam  Constitutional: She is oriented to person, place, and time.  Neck: Normal range of motion. No thyromegaly present.  Cardiovascular: Normal rate, regular rhythm and normal heart sounds.   Pulmonary/Chest: Effort normal and breath sounds normal.  Neurological: She is alert and oriented to person, place, and time.  Skin: Skin is warm and dry.     Lab Results  Component Value Date   WBC 7.2 12/01/2012   HGB 14.3 12/01/2012   HCT 39.8 12/01/2012   MCV 82.2 12/01/2012   PLT 254 12/01/2012   Lab Results  Component Value Date   CREATININE 0.63 12/01/2012   BUN 16 12/01/2012   NA 137 12/01/2012   K 4.2 12/01/2012   CL 102 12/01/2012   CO2 29 12/01/2012    Lab Results  Component Value Date   HGBA1C 6.3* 02/26/2009   Lipid Panel     Component Value Date/Time   CHOL 191 01/07/2014 1025   TRIG 82 01/07/2014 1025   HDL 54 01/07/2014 1025   CHOLHDL 3.5 01/07/2014 1025  VLDL 16 01/07/2014 1025   LDLCALC 121* 01/07/2014 1025       Assessment and plan:   Tiandra was seen today for follow-up.  Diagnoses and all orders for this visit:  Thyroid disease -     levothyroxine (SYNTHROID, LEVOTHROID) 88 MCG tablet; Take 1 tablet (88 mcg total) by mouth daily before breakfast. -     TSH -     T4, Free  Other fatigue -     CBC with Differential -     Vitamin D, 25-hydroxy  Need for influenza vaccination -     Flu Vaccine QUAD 36+ mos PF IM (Fluarix & Fluzone Quad PF)   Due to language barrier, an interpreter was present during the history-taking and subsequent discussion (and for part of the physical exam) with this patient.   Return in about 6 months (around 05/08/2015) for thyroid.        Ambrose Finland, NP-C [facility] [phone] 11/07/2014, 10:52  AM

## 2014-11-07 NOTE — Progress Notes (Signed)
Virtual interpreter used Audrey Schwartz ID# [id number] Patient here for follow up on her thyroid disease and  Medication refill

## 2014-11-08 ENCOUNTER — Telehealth: Payer: Self-pay

## 2014-11-08 LAB — TSH: TSH: 1.293 u[IU]/mL (ref 0.350–4.500)

## 2014-11-08 LAB — T4, FREE: Free T4: 1.1 ng/dL (ref 0.80–1.80)

## 2014-11-08 LAB — VITAMIN D 25 HYDROXY (VIT D DEFICIENCY, FRACTURES): Vit D, 25-Hydroxy: 24 ng/mL — ABNORMAL LOW (ref 30–100)

## 2014-11-08 NOTE — Telephone Encounter (Signed)
-----   Message from Ambrose Finland, NP sent at 11/08/2014  9:26 AM EDT ----- TSH is normal. She is on the correct dose of medication. Refills have been sent. She needs to get some OTC Viatmin D 1,000 IU to take daily. Her levels are low and could be contributing to her fatigue

## 2014-11-08 NOTE — Telephone Encounter (Signed)
Interpreter line used Audrey Schwartz ID# [id number] Patient is aware of her lab results

## 2015-02-24 ENCOUNTER — Ambulatory Visit (INDEPENDENT_AMBULATORY_CARE_PROVIDER_SITE_OTHER): Payer: BLUE CROSS/BLUE SHIELD | Admitting: Family Medicine

## 2015-02-24 VITALS — BP 108/62 | HR 85 | Temp 98.4°F | Resp 16 | Ht 63.0 in | Wt 128.0 lb

## 2015-02-24 DIAGNOSIS — J209 Acute bronchitis, unspecified: Secondary | ICD-10-CM

## 2015-02-24 DIAGNOSIS — J0100 Acute maxillary sinusitis, unspecified: Secondary | ICD-10-CM

## 2015-02-24 MED ORDER — HYDROCODONE-HOMATROPINE 5-1.5 MG/5ML PO SYRP: 5.0000 mL | ORAL_SOLUTION | Freq: Three times a day (TID) | ORAL | Status: DC | PRN

## 2015-02-24 MED ORDER — AMOXICILLIN-POT CLAVULANATE 875-125 MG PO TABS: 1.0000 | ORAL_TABLET | Freq: Two times a day (BID) | ORAL | Status: DC

## 2015-02-24 NOTE — Progress Notes (Signed)
Subjective:  This chart was scribed for Audrey Schwartz, by Veverly Fells, at [facility].  This patient was seen in room  13  and the patient's care was started at 1:26 PM.   Chief Complaint  Patient presents with  . Chills    x 1 week   . Fever  . Cough  . Generalized Body Aches  . Headache     Patient ID: Audrey Schwartz, female    DOB: 18-Aug-1960, 55 y.o.   MRN: [id number]  HPI HPI Comments: Audrey Schwartz is a 55 y.o. female who presents to the [facility] complaining of a cough with associated symptoms of a fever/chills, body aches, headache and a sore throat and pressure in her sinuses onset 5 week ago.  Patient has not history of asthma.  She has no other complaints or concerns today.   She denies any nausea/vomitting.   Patient works in [employer].   She is spanish speaking.    Patient Active Problem List   Diagnosis Date Noted  . Unspecified hypothyroidism 12/01/2012  . Annual physical exam 12/01/2012  . Contact dermatitis 12/01/2012  . Hypothyroidism 05/26/2012  . Hematuria 05/26/2012  . Suprapubic tenderness 05/26/2012   Past Medical History  Diagnosis Date  . No pertinent past medical history     PT IS A POOR HISTORIAN   . Hypothyroidism   . Thyroid disease   . Headache(784.0)     and nausea occasionally  . Complication of anesthesia 1997    slow to awaken after c section due to dentures accidentally left in  . Bruises easily for a long time    saw doctor, no problem found   Past Surgical History  Procedure Laterality Date  . Cesarean section  1997  . Colonoscopy with propofol N/A 08/07/2013    Procedure: COLONOSCOPY WITH PROPOFOL;  Surgeon: Charolett Bumpers, Schwartz;  Location: [facility];  Service: Endoscopy;  Laterality: N/A;   No Known Allergies Prior to Admission medications   Medication Sig Start Date End Date Taking? Authorizing Provider  hydrocortisone cream 0.5 % Apply topically 2  (two) times daily. 01/07/14  Yes Doris Cheadle, Schwartz  levothyroxine (SYNTHROID, LEVOTHROID) 88 MCG tablet Take 1 tablet (88 mcg total) by mouth daily before breakfast. 11/07/14  Yes Ambrose Finland, NP  naproxen (NAPROSYN) 500 MG tablet Take 1 tablet (500 mg total) by mouth 2 (two) times daily with a meal. 03/15/13  Yes Doris Cheadle, Schwartz   Social History   Social History  . Marital Status: Single    Spouse Name: N/A  . Number of Children: N/A  . Years of Education: N/A   Occupational History  . Not on file.   Social History Main Topics  . Smoking status: Never Smoker   . Smokeless tobacco: Never Used  . Alcohol Use: No  . Drug Use: No  . Sexual Activity: No   Other Topics Concern  . Not on file   Social History Narrative      Review of Systems  Constitutional: Positive for fever and chills.  HENT: Positive for sinus pressure and sore throat. Negative for ear discharge and ear pain.   Eyes: Negative for pain, redness and itching.  Respiratory: Positive for cough. Negative for choking and shortness of breath.   Gastrointestinal: Negative for nausea and vomiting.  Musculoskeletal: Negative for neck pain and neck stiffness.  Skin: Negative for wound.  Neurological: Positive for headaches. Negative for seizures,  syncope and speech difficulty.       Objective:   Physical Exam  Constitutional: She appears well-developed and well-nourished. No distress.  HENT:  Head: Normocephalic and atraumatic.  Swollen nasal passages.   Eyes: Pupils are equal, round, and reactive to light.  Pulmonary/Chest: Effort normal. No respiratory distress.   Few rhonchi bilaterally.   Psychiatric: She has a normal mood and affect. Her behavior is normal.   Filed Vitals:   02/24/15 1258  BP: 108/62  Pulse: 85  Temp: 98.4 F (36.9 C)  TempSrc: Oral  Resp: 16  Height:  (1.6 m)  Weight: 128 lb (58.06 kg)  SpO2: 98%          Assessment & Plan:   This chart was scribed in my  presence and reviewed by me personally.    ICD-9-CM ICD-10-CM   1. Acute bronchitis, unspecified organism 466.0 J20.9   2. Acute maxillary sinusitis, recurrence not specified 461.0 J01.00      Signed, Audrey Sidle, Schwartz

## 2015-02-24 NOTE — Patient Instructions (Signed)
Sinusitis en adultos (Sinusitis, Adult) La sinusitis es el enrojecimiento, el dolor y la inflamacin de los senos paranasales. Los senos paranasales son cavidades de aire que estn dentro de los huesos de la cara. Se encuentran debajo de los ojos, en la mitad de la frente y encima de los ojos. En los senos paranasales sanos, el moco puede drenar y el aire circula a travs de ellos en su camino hacia la nariz. Sin embargo, cuando se inflaman, el moco y el aire quedan atrapados. Esto hace que se desarrollen bacterias y otros grmenes que causan infeccin. La sinusitis puede desarrollarse rpidamente y durar solo un tiempo corto (aguda) o continuar por un perodo largo (crnica). La sinusitis que dura ms de 12 semanas se considera crnica. CAUSAS Las causas de la sinusitis son:  Alergias.  Las anomalas estructurales, como el desplazamiento del cartlago que separa las fosas nasales (desvo del tabique), que puede disminuir el flujo de aire por la nariz y los senos paranasales, y afectar su drenaje.  Las anomalas funcionales, como cuando los pequeos pelos (cilias) que se encuentran en los senos paranasales y ayudan a eliminar el moco no funcionan correctamente o no estn presentes. SIGNOS Y SNTOMAS Los sntomas de la sinusitis aguda y crnica son los mismos. Los sntomas principales son el dolor y la presin alrededor de los senos paranasales afectados. Otros sntomas son:  Dolor en los dientes superiores.  Dolor de odos.  Dolor de cabeza.  Mal aliento.  Disminucin del sentido del olfato y del gusto.  Tos, que empeora al acostarse.  Fatiga.  Fiebre.  Drenaje de moco espeso por la nariz, que generalmente es de color verde y puede contener pus (purulento).  Hinchazn y calor en los senos paranasales afectados. DIAGNSTICO Su mdico le realizar un examen fsico. Durante el examen, el mdico puede hacer cualquiera de estas cosas para determinar si usted tiene sinusitis aguda o  crnica:  Le revisar la nariz para buscar signos de crecimientos anormales en las fosas nasales (plipos nasales).  Palpar los senos paranasales afectados para buscar signos de infeccin.  Observar la parte interna de los senos paranasales con un dispositivo que tiene una luz (endoscopio). Si el mdico sospecha que usted sufre sinusitis crnica, podr indicar una o ms de las siguientes pruebas:  Pruebas de alergia.  Cultivo de las secreciones nasales. Se extrae una muestra de moco de la nariz, que se enva al laboratorio para detectar bacterias.  Citologa nasal. Se extrae una muestra de moco de la nariz, que el mdico examina para determinar si la sinusitis est relacionada con una alergia. TRATAMIENTO La mayora de los casos de sinusitis aguda se deben a una infeccin viral y se resuelven espontneamente en un perodo de 10das. A veces, se recetan medicamentos como ayuda para aliviar los sntomas tanto de la sinusitis aguda como de la crnica. Estos pueden incluir analgsicos, descongestivos, aerosoles nasales con corticoides o aerosoles de solucin salina. Sin embargo, para la sinusitis por infeccin bacteriana, el mdico le recetar antibiticos. Los antibiticos son medicamentos que destruyen las bacterias que causan la infeccin. Con poca frecuencia, la sinusitis tiene su origen en una infeccin por hongos. En estos casos, el mdico le recetar un medicamento antimictico. Para algunos casos de sinusitis crnica, es necesario someterse a una ciruga. Generalmente, se trata de casos en los que la sinusitis se repite ms de 3veces al ao, a pesar de otros tratamientos. INSTRUCCIONES PARA EL CUIDADO EN EL HOGAR  Beber abundante agua. Los lquidos ayudan a disolver   el moco, para que drene ms fcilmente de los senos paranasales.  Use un humidificador.  Inhale vapor de 3a 4veces al da (por ejemplo, sintese en el bao con la [place]).  Aplquese un pao tibio y hmedo en  la cara 3 o 4veces al da o segn las indicaciones del mdico.  Use un aerosol nasal salino para ayudar a Environmental education officer y [employer] senos nasales.  Tome los medicamentos solamente como se lo haya indicado el mdico.  Si le recetaron un antimictico o un antibitico, asegrese de terminarlos, incluso si comienza a sentirse mejor. SOLICITE ATENCIN MDICA DE INMEDIATO SI:  Siente ms dolor o sufre dolores de cabeza intensos.  Tiene nuseas, vmitos o somnolencia.  Observa hinchazn alrededor del rostro.  Tiene problemas de visin.  Presenta rigidez en el cuello.  Tiene dificultad para respirar.   Esta informacin no tiene Theme park manager el consejo del mdico. Asegrese de hacerle al mdico cualquier pregunta que tenga.   Document Released: 10/28/2004 Document Revised: 02/08/2014 Elsevier Interactive Patient Education 2016 [employer]. Bronquitis aguda (Acute Bronchitis) La bronquitis es una inflamacin de las vas respiratorias que se extienden desde la trquea [employer] pulmones (bronquios). La inflamacin produce la formacin de mucosidad. Esto produce tos, que es el sntoma ms frecuente de la bronquitis.  Cuando la bronquitis es [place], generalmente comienza de [place] sbita y desaparece luego de un par de semanas. El hbito de fumar, las alergias y el asma pueden empeorar la bronquitis. Los episodios repetidos de bronquitis pueden causar ms problemas pulmonares.  CAUSAS La causa ms frecuente de bronquitis aguda es el mismo virus que produce el resfro. El virus puede propagarse de Neomia Dear persona a la otra (contagioso) a travs de la tos y los estornudos, y al tocar objetos contaminados. SIGNOS Y SNTOMAS   Tos.  Grant Ruts.  Tos con mucosidad.  Dolores [employer] cuerpo.  Congestin en el pecho.  Escalofros.  Falta de aire.  Dolor de Advertising copywriter. DIAGNSTICO  La bronquitis aguda en general se diagnostica con un examen fsico. El mdico tambin le har preguntas sobre su  historia clnica. En algunos casos se indican otros estudios, como radiografas, para Risk manager.  TRATAMIENTO  La bronquitis aguda generalmente desaparece en un par de semanas. Con frecuencia, no es Quar[place] manager. Los medicamentos se indican para aliviar la fiebre o la tos. Generalmente, no es necesario el uso de antibiticos, pero pueden recetarse en ciertas ocasiones. En algunos casos, se recomienda el uso de un inhalador para mejorar la falta de aire y Scientist, physiological tos. Un vaporizador de aire fro podr ayudarlo a [employer] bronquiales y Statistician su eliminacin.  INSTRUCCIONES PARA EL CUIDADO EN EL HOGAR  Descanse lo suficiente.  Beba lquidos en abundancia para mantener la orina de color claro o amarillo plido (excepto que padezca una enfermedad que requiera la restriccin de lquidos). El aumento de lquidos puede ayudar a que las secreciones respiratorias (esputo) sean menos espesas y a reducir la congestin del pecho, y Transport planner deshidratacin.  Tome los medicamentos solamente como se lo haya indicado el mdico.  Si le recetaron antibiticos, asegrese de terminarlos, incluso si comienza a sentirse mejor.  Evite fumar o aspirar el humo de otros fumadores. La exposicin al humo del cigarrillo o a irritantes qumicos har que la bronquitis empeore. Si fuma, considere el uso de goma de Theatre manager o la aplicacin de parches en la piel que contengan nicotina para Paramedic los sntomas de abstinencia. Si deja  de fumar, sus pulmones se curarn ms rpido.  Reduzca la probabilidad de otro episodio de bronquitis aguda lavando sus manos con frecuencia, evitando a las personas que tengan sntomas y tratando de no tocarse las manos con la boca, la nariz o los ojos.  Concurra a todas las visitas de control como se lo haya indicado el mdico. SOLICITE ATENCIN MDICA SI: Los sntomas no mejoran despus de una semana de [place].  SOLICITE ATENCIN  MDICA DE INMEDIATO SI:  Comienza a tener fiebre o escalofros cada vez ms intensos.  Siente dolor en el pecho.  Le falta el aire de manera preocupante.  La flema tiene [place].  Se deshidrata.  Se desmaya o siente que va a desmayarse de forma repetida.  Tiene vmitos que se repiten.  Tiene un dolor de cabeza intenso. ASEGRESE DE QUE:   Comprende estas instrucciones.  Controlar su afeccin.  Recibir ayuda de inmediato si no mejora o si empeora.   Esta informacin no tiene Theme park manager el consejo del mdico. Asegrese de hacerle al mdico cualquier pregunta que tenga.   Document Released: 01/18/2005 Document Revised: 02/08/2014 Elsevier Interactive Patient Education [employer]

## 2015-02-24 NOTE — Addendum Note (Signed)
Addended by: Elvina Sidle on: 02/24/2015 01:40 PM   Modules accepted: Orders

## 2015-03-03 ENCOUNTER — Ambulatory Visit (INDEPENDENT_AMBULATORY_CARE_PROVIDER_SITE_OTHER): Payer: BLUE CROSS/BLUE SHIELD | Admitting: Urgent Care

## 2015-03-03 ENCOUNTER — Ambulatory Visit (INDEPENDENT_AMBULATORY_CARE_PROVIDER_SITE_OTHER): Payer: BLUE CROSS/BLUE SHIELD

## 2015-03-03 VITALS — BP 122/72 | HR 68 | Temp 98.4°F | Resp 17 | Ht 62.5 in | Wt 129.0 lb

## 2015-03-03 DIAGNOSIS — R05 Cough: Secondary | ICD-10-CM | POA: Diagnosis not present

## 2015-03-03 DIAGNOSIS — R509 Fever, unspecified: Secondary | ICD-10-CM

## 2015-03-03 DIAGNOSIS — J988 Other specified respiratory disorders: Secondary | ICD-10-CM

## 2015-03-03 DIAGNOSIS — J019 Acute sinusitis, unspecified: Secondary | ICD-10-CM

## 2015-03-03 DIAGNOSIS — R059 Cough, unspecified: Secondary | ICD-10-CM

## 2015-03-03 DIAGNOSIS — J22 Unspecified acute lower respiratory infection: Secondary | ICD-10-CM

## 2015-03-03 MED ORDER — PSEUDOEPHEDRINE HCL ER 120 MG PO TB12
120.0000 mg | ORAL_TABLET | Freq: Two times a day (BID) | ORAL | Status: DC
Start: 2015-03-03 — End: 2015-03-10

## 2015-03-03 MED ORDER — AZITHROMYCIN 250 MG PO TABS: ORAL_TABLET | ORAL | Status: DC

## 2015-03-03 MED ORDER — BENZONATATE 100 MG PO CAPS: 100.0000 mg | ORAL_CAPSULE | Freq: Three times a day (TID) | ORAL | Status: DC | PRN

## 2015-03-03 MED ORDER — HYDROCODONE-HOMATROPINE 5-1.5 MG/5ML PO SYRP: 5.0000 mL | ORAL_SOLUTION | Freq: Every evening | ORAL | Status: DC | PRN

## 2015-03-03 NOTE — Patient Instructions (Addendum)
Because you received an x-ray today, you will receive an invoice from [facility]. Please contact [facility] at [phone] with questions or concerns regarding your invoice. Our billing staff will not be able to assist you with those questions.   Sinusitis en adultos (Sinusitis, Adult) La sinusitis es el enrojecimiento, el dolor y la inflamacin de los senos paranasales. Los senos paranasales son cavidades de aire que estn dentro de los huesos de la cara. Se encuentran debajo de los ojos, en la mitad de la frente y encima de los ojos. En los senos paranasales sanos, el moco puede drenar y el aire circula a travs de ellos en su camino hacia la Clinical cytogeneticist. Sin embargo, cuando se [place], el moco y el aire [place]. Esto hace que se desarrollen bacterias y otros grmenes que causan infeccin. La sinusitis puede desarrollarse rpidamente y durar solo un tiempo corto (aguda) o continuar por un perodo largo (crnica). La sinusitis que dura ms de 12 semanas se considera crnica. CAUSAS Las causas de la sinusitis son:  [place].  Las [employer], como el desplazamiento del cartlago que separa las fosas nasales (desvo del tabique), que puede disminuir el flujo de aire por la nariz y los senos paranasales, y Audiological scientist su drenaje.  Las anomalas funcionales, como cuando los pequeos pelos (cilias) que se encuentran en los senos paranasales y ayudan a eliminar el moco no funcionan correctamente o no estn presentes. SIGNOS Y SNTOMAS Los sntomas de la sinusitis aguda y [place] son los mismos. Los sntomas principales son Chief Technology Officer y la presin alrededor de los senos paranasales afectados. Otros sntomas son:  Careers information officer.  Dolor de odos.  Dolor de [place].  Mal aliento.  Disminucin del sentido del olfato y del gusto.  Tos, que empeora al [employer]  Fatiga.  Grant Ruts.  Drenaje de moco espeso por la nariz, que generalmente es de color  verde y puede contener pus (purulento).  Hinchazn y calor en los senos paranasales afectados. DIAGNSTICO Su mdico le Medical sales representative examen fsico. Durante el examen, el mdico puede hacer cualquiera de estas cosas para determinar si usted tiene sinusitis aguda o crnica:  Le revisar la nariz para buscar signos de crecimientos anormales en las fosas nasales (plipos nasales).  Palpar los senos paranasales afectados para buscar signos de infeccin.  Observar la parte interna de los senos paranasales con un dispositivo que tiene una luz (endoscopio). Si el mdico sospecha que usted sufre sinusitis crnica, podr indicar una o ms de las siguientes pruebas:  Pruebas de Programmer, multimedia.  Cultivo de las Yahoo. Se extrae Lauris Poag de moco de la nariz, que se enva al laboratorio para detectar bacterias.  Citologa nasal. Se extrae Lauris Poag de moco de la nariz, que el mdico examina para determinar si la sinusitis est relacionada con Vella Raring. TRATAMIENTO La mayora de los casos de sinusitis aguda se deben a una infeccin viral y se resuelven espontneamente en un perodo de 10das. A veces, se recetan medicamentos como ayuda para [employer] sntomas tanto de la sinusitis aguda como de la crnica. Estos pueden incluir analgsicos, descongestivos, aerosoles nasales con corticoides o aerosoles de solucin salina. Sin embargo, para la sinusitis por infeccin bacteriana, Chief Operating Officer. Los antibiticos son medicamentos que destruyen las bacterias que causan la infeccin. Con poca frecuencia, la sinusitis tiene su origen en una infeccin por hongos. En estos casos, Actor un medicamento antimictico. Para algunos casos de sinusitis crnica, es necesario  someterse a [place]. Generalmente, se trata de [employer] la sinusitis se repite ms de 3veces al ao, a pesar de otros tratamientos. INSTRUCCIONES PARA EL CUIDADO EN EL HOGAR  Beber  abundante agua. Los lquidos ayudan a [employer], para que drene ms fcilmente de los senos paranasales.  Use un humidificador.  Inhale vapor de 3a 4veces al da (por ejemplo, sintese en el bao con la [place]).  Aplquese un pao tibio y hmedo en la cara 3 o 4veces al da o segn las indicaciones del mdico.  Use un aerosol nasal salino para ayudar a Environmental education officer y [employer] senos nasales.  Tome los medicamentos solamente como se lo haya indicado el mdico.  Si le recetaron un antimictico o un antibitico, asegrese de terminarlos, incluso si comienza a sentirse mejor. SOLICITE ATENCIN MDICA DE INMEDIATO SI:  Siente ms dolor o sufre dolores de cabeza intensos.  Tiene nuseas, vmitos o somnolencia.  Observa hinchazn alrededor del rostro.  Tiene problemas de visin.  Presenta rigidez en el cuello.  Tiene dificultad para respirar.   Esta informacin no tiene Theme park manager el consejo del mdico. Asegrese de hacerle al mdico cualquier pregunta que tenga.   Document Released: 10/28/2004 Document Revised: 02/08/2014 Elsevier Interactive Patient Education [employer]

## 2015-03-03 NOTE — Progress Notes (Signed)
    MRN: [id number] DOB: [birth year]  Subjective:   Audrey Schwartz is a 55 y.o. female presenting for chief complaint of Cough and fever last night  Reports 3 week history of productive cough, cough worse at night, cough elicits chest pain, wheezing, subjective fever. Patient was seen on 02/24/2015, treated with Augmentin, Hycodan. Patient was unable to take Augmentin due to severe nausea and belly pain. She has used Tylenol, Hycodan with some relief but not resolution of her symptoms. Admits some improvement in her congestion, sinus pain but still has   Audrey Schwartz has a current medication list which includes the following prescription(s): acetaminophen, amoxicillin-clavulanate, and hydrocodone-homatropine. Also has No Known Allergies.  Audrey Schwartz  has a past medical history of No pertinent past medical history; Hypothyroidism; Thyroid disease; Headache(784.0); Complication of anesthesia (1997); and Bruises easily (for a long time). Also  has past surgical history that includes Cesarean section (1997) and Colonoscopy with propofol (N/A, 08/07/2013).  Objective:   Vitals: BP 122/72 mmHg  Pulse 68  Temp(Src) 98.4 F (36.9 C) (Oral)  Resp 17  Ht 5' 2.5" (1.588 m)  Wt 129 lb (58.514 kg)  BMI 23.20 kg/m2  SpO2 98%  Physical Exam  Constitutional: She is oriented to person, place, and time. She appears well-developed and well-nourished.  HENT:  TM's intact bilaterally, no effusions or erythema. Nasal turbinates inflamed, erythematous with thick white mucus, R>L. No sinus tenderness. Postnasal drip present, without oropharyngeal exudates, erythema or abscesses.  Eyes: Right eye exhibits no discharge. Left eye exhibits no discharge. No scleral icterus.  Neck: Normal range of motion. Neck supple.  Cardiovascular: Normal rate, regular rhythm and intact distal pulses.  Exam reveals no gallop and no friction rub.   No murmur heard. Pulmonary/Chest: No respiratory distress. She has wheezes (left side  anterior mid lung field). She has no rales.  Lymphadenopathy:    She has no cervical adenopathy.  Neurological: She is alert and oriented to person, place, and time.  Skin: Skin is warm and dry.   Dg Chest 2 View  03/03/2015  CLINICAL DATA:  Cough; Fever; Chest pain. EXAM: CHEST  2 VIEW COMPARISON:  None. FINDINGS: The heart size and mediastinal contours are within normal limits. Both lungs are clear. The visualized skeletal structures are unremarkable. Calcified granuloma is identified in the right lung apex. IMPRESSION: No active cardiopulmonary disease. Electronically Signed   By: Norva Pavlov M.D.   On: 03/03/2015 10:15   Assessment and Plan :   1. Acute sinusitis, recurrence not specified, unspecified location 2. Cough 3. Fever, unspecified 4. Lower respiratory infection - Continue with supportive care, will cover for infectious process. Patient cannot tolerate Augmentin, will switch to Azithromycin. RTC in 1 week if no improvement. Radiologist over-read pending.  Wallis Bamberg, PA-C [facility] [phone] 03/03/2015 9:50 AM

## 2015-03-06 ENCOUNTER — Encounter (HOSPITAL_COMMUNITY): Payer: Self-pay | Admitting: *Deleted

## 2015-03-06 ENCOUNTER — Emergency Department (HOSPITAL_COMMUNITY)
Admission: EM | Admit: 2015-03-06 | Discharge: 2015-03-06 | Disposition: A | Discharge status: Home / Self Care | Payer: BLUE CROSS/BLUE SHIELD | Attending: Emergency Medicine | Admitting: Emergency Medicine

## 2015-03-06 DIAGNOSIS — Z880 Allergy status to penicillin: Secondary | ICD-10-CM | POA: Insufficient documentation

## 2015-03-06 DIAGNOSIS — E039 Hypothyroidism, unspecified: Secondary | ICD-10-CM | POA: Diagnosis not present

## 2015-03-06 DIAGNOSIS — Z792 Long term (current) use of antibiotics: Secondary | ICD-10-CM | POA: Insufficient documentation

## 2015-03-06 DIAGNOSIS — J0100 Acute maxillary sinusitis, unspecified: Secondary | ICD-10-CM | POA: Diagnosis not present

## 2015-03-06 DIAGNOSIS — R509 Fever, unspecified: Secondary | ICD-10-CM | POA: Diagnosis present

## 2015-03-06 DIAGNOSIS — Z79899 Other long term (current) drug therapy: Secondary | ICD-10-CM | POA: Diagnosis not present

## 2015-03-06 MED ORDER — BENZONATATE 100 MG PO CAPS: 100.0000 mg | ORAL_CAPSULE | Freq: Three times a day (TID) | ORAL | Status: DC

## 2015-03-06 MED ORDER — AZITHROMYCIN 250 MG PO TABS: 250.0000 mg | ORAL_TABLET | Freq: Every day | ORAL | Status: DC

## 2015-03-06 NOTE — ED Notes (Addendum)
Patient has been seen twice for similar symptoms with urgent care. She has been prescribed antibiotics both times along with hycodan. Patient states that she still is having the same symptoms. She complains of fever for 3 weeks. Patient is afebrile today and has not taken any anti-pyretics. It has been verified with [facility] pharmacy that the patient has prescriptions to be picked up from her visit 3 days ago that she did not go pick up.

## 2015-03-06 NOTE — Discharge Instructions (Signed)
Sinusitis en adultos (Sinusitis, Adult) La sinusitis es el enrojecimiento, el dolor y la inflamacin de los senos paranasales. Los senos paranasales son cavidades de aire que estn dentro de los huesos de la cara. Se encuentran debajo de los ojos, en la mitad de la frente y encima de los ojos. En los senos paranasales sanos, el moco puede drenar y el aire circula a travs de ellos en su camino hacia la nariz. Sin embargo, cuando se inflaman, el moco y el aire quedan atrapados. Esto hace que se desarrollen bacterias y otros grmenes que causan infeccin. La sinusitis puede desarrollarse rpidamente y durar solo un tiempo corto (aguda) o continuar por un perodo largo (crnica). La sinusitis que dura ms de 12 semanas se considera crnica. CAUSAS Las causas de la sinusitis son:  Alergias.  Las anomalas estructurales, como el desplazamiento del cartlago que separa las fosas nasales (desvo del tabique), que puede disminuir el flujo de aire por la nariz y los senos paranasales, y afectar su drenaje.  Las anomalas funcionales, como cuando los pequeos pelos (cilias) que se encuentran en los senos paranasales y ayudan a eliminar el moco no funcionan correctamente o no estn presentes. SIGNOS Y SNTOMAS Los sntomas de la sinusitis aguda y crnica son los mismos. Los sntomas principales son el dolor y la presin alrededor de los senos paranasales afectados. Otros sntomas son:  Dolor en los dientes superiores.  Dolor de odos.  Dolor de cabeza.  Mal aliento.  Disminucin del sentido del olfato y del gusto.  Tos, que empeora al acostarse.  Fatiga.  Fiebre.  Drenaje de moco espeso por la nariz, que generalmente es de color verde y puede contener pus (purulento).  Hinchazn y calor en los senos paranasales afectados. DIAGNSTICO Su mdico le realizar un examen fsico. Durante el examen, el mdico puede hacer cualquiera de estas cosas para determinar si usted tiene sinusitis aguda o  crnica:  Le revisar la nariz para buscar signos de crecimientos anormales en las fosas nasales (plipos nasales).  Palpar los senos paranasales afectados para buscar signos de infeccin.  Observar la parte interna de los senos paranasales con un dispositivo que tiene una luz (endoscopio). Si el mdico sospecha que usted sufre sinusitis crnica, podr indicar una o ms de las siguientes pruebas:  Pruebas de alergia.  Cultivo de las secreciones nasales. Se extrae una muestra de moco de la nariz, que se enva al laboratorio para detectar bacterias.  Citologa nasal. Se extrae una muestra de moco de la nariz, que el mdico examina para determinar si la sinusitis est relacionada con una alergia. TRATAMIENTO La mayora de los casos de sinusitis aguda se deben a una infeccin viral y se resuelven espontneamente en un perodo de 10das. A veces, se recetan medicamentos como ayuda para aliviar los sntomas tanto de la sinusitis aguda como de la crnica. Estos pueden incluir analgsicos, descongestivos, aerosoles nasales con corticoides o aerosoles de solucin salina. Sin embargo, para la sinusitis por infeccin bacteriana, el mdico le recetar antibiticos. Los antibiticos son medicamentos que destruyen las bacterias que causan la infeccin. Con poca frecuencia, la sinusitis tiene su origen en una infeccin por hongos. En estos casos, el mdico le recetar un medicamento antimictico. Para algunos casos de sinusitis crnica, es necesario someterse a una ciruga. Generalmente, se trata de casos en los que la sinusitis se repite ms de 3veces al ao, a pesar de otros tratamientos. INSTRUCCIONES PARA EL CUIDADO EN EL HOGAR  Beber abundante agua. Los lquidos ayudan a disolver   el moco, para que drene ms fcilmente de los senos paranasales.  Use un humidificador.  Inhale vapor de 3a 4veces al da (por ejemplo, sintese en el bao con la ducha abierta).  Aplquese un pao tibio y hmedo en  la cara 3 o 4veces al da o segn las indicaciones del mdico.  Use un aerosol nasal salino para ayudar a humedecer y limpiar los senos nasales.  Tome los medicamentos solamente como se lo haya indicado el mdico.  Si le recetaron un antimictico o un antibitico, asegrese de terminarlos, incluso si comienza a sentirse mejor. SOLICITE ATENCIN MDICA DE INMEDIATO SI:  Siente ms dolor o sufre dolores de cabeza intensos.  Tiene nuseas, vmitos o somnolencia.  Observa hinchazn alrededor del rostro.  Tiene problemas de visin.  Presenta rigidez en el cuello.  Tiene dificultad para respirar.   Esta informacin no tiene como fin reemplazar el consejo del mdico. Asegrese de hacerle al mdico cualquier pregunta que tenga.   Document Released: 10/28/2004 Document Revised: 02/08/2014 Elsevier Interactive Patient Education 2016 Elsevier Inc.   

## 2015-03-06 NOTE — ED Provider Notes (Signed)
CSN: [id number]     Arrival date & time 03/06/15  1122 History   First MD Initiated Contact with Patient 03/06/15 1253     Chief Complaint  Patient presents with  . Fever     (Consider location/radiation/quality/duration/timing/severity/associated sxs/prior Treatment) HPI Comments: 55 year old female who presents with fever and cough. The patient has been evaluated twice recently for upper respiratory infection symptoms including cough, sinus pressure, and fevers. Patient has been afebrile today and has not taken any medications. She has been taking Sudafed for the sinus pressure and was originally prescribed Augmentin but wasn't able to tolerate due to GI upset. At her recent visit, she was given prescriptions for azithromycin and Tessalon but she was confused and was unable to get the medications. There may have been a problem with her insurance covering the medications. She is here today to receive those prescriptions. No vomiting, diarrhea, or abdominal pain.  Patient is a 55 y.o. female presenting with fever. The history is provided by the patient. The history is limited by a language barrier.  Fever   Past Medical History  Diagnosis Date  . No pertinent past medical history     PT IS A POOR HISTORIAN   . Hypothyroidism   . Thyroid disease   . Headache(784.0)     and nausea occasionally  . Complication of anesthesia 1997    slow to awaken after c section due to dentures accidentally left in  . Bruises easily for a long time    saw doctor, no problem found   Past Surgical History  Procedure Laterality Date  . Cesarean section  1997  . Colonoscopy with propofol N/A 08/07/2013    Procedure: COLONOSCOPY WITH PROPOFOL;  Surgeon: Charolett Bumpers, MD;  Location: [facility];  Service: Endoscopy;  Laterality: N/A;   No family history on file. Social History  Substance Use Topics  . Smoking status: Never Smoker   . Smokeless tobacco: Never Used  . Alcohol Use: No   OB History    Gravida Para Term Preterm AB TAB SAB Ectopic Multiple Living   Review of Systems  Constitutional: Positive for fever.   10 Systems reviewed and are negative for acute change except as noted in the HPI.    Allergies  Amoxicillin  Home Medications   Prior to Admission medications   Medication Sig Start Date End Date Taking? Authorizing Provider  acetaminophen (TYLENOL) 500 MG tablet Take 500 mg by mouth every 6 (six) hours as needed for moderate pain or headache.    Yes Historical Provider, MD  HYDROcodone-homatropine (HYCODAN) 5-1.5 MG/5ML syrup Take 5 mLs by mouth at bedtime as needed. Patient taking differently: Take 5 mLs by mouth at bedtime as needed for cough.  03/03/15  Yes Wallis Bamberg, PA-C  levothyroxine (SYNTHROID, LEVOTHROID) 88 MCG tablet Take 88 mcg by mouth daily before breakfast.   Yes Historical Provider, MD  pseudoephedrine (SUDAFED 12 HOUR) 120 MG 12 hr tablet Take 1 tablet (120 mg total) by mouth 2 (two) times daily. 03/03/15  Yes Wallis Bamberg, PA-C  amoxicillin-clavulanate (AUGMENTIN) 875-125 MG tablet Take 1 tablet by mouth 2 (two) times daily.    Historical Provider, MD  azithromycin (ZITHROMAX Z-PAK) 250 MG tablet Take 1 tablet (250 mg total) by mouth daily.  PO day 1, then  PO days 205 03/06/15   Laurence Spates, MD  benzonatate (TESSALON) 100 MG capsule Take 1 capsule (  100 mg total) by mouth every 8 (eight) hours. 03/06/15   Ambrose Finland Little, MD   BP 114/80 mmHg  Pulse 87  Temp(Src) 97.7 F (36.5 C) (Oral)  Resp 20  SpO2 100% Physical Exam  Constitutional: She is oriented to person, place, and time. She appears well-developed and well-nourished. No distress.  HENT:  Head: Normocephalic and atraumatic.  Mouth/Throat: Oropharynx is clear and moist.  No maxillary or frontal sinus TTP; Moist mucous membranes  Eyes: Conjunctivae are normal. Pupils are equal, round, and reactive to light.  Neck: Neck supple.  Cardiovascular:  Normal rate, regular rhythm and normal heart sounds.   No murmur heard. Pulmonary/Chest: Effort normal and breath sounds normal. No respiratory distress. She has no wheezes.  Abdominal: Soft. Bowel sounds are normal. She exhibits no distension. There is no tenderness.  Musculoskeletal: She exhibits no edema.  Neurological: She is alert and oriented to person, place, and time.  Fluent speech  Skin: Skin is warm and dry.  Psychiatric: She has a normal mood and affect. Judgment normal.  Nursing note and vitals reviewed.   ED Course  Procedures (including critical care time) Labs Review Labs Reviewed - No data to display    MDM   Final diagnoses:  Acute maxillary sinusitis, recurrence not specified    Patient with 2 recent visits for her URI symptoms presents for prescriptions that she was unable to get a few days ago. She was well-appearing with normal vital signs on exam. O2 sat 100% on room air. Chest x-ray from 2 days ago is normal. Because the other provider wanted to treat sinusitis, I re-wrote Rx for azithro and tessalon. Patient discharged in satisfactory condition.  Laurence Spates, MD 03/06/15 1320

## 2015-03-10 ENCOUNTER — Ambulatory Visit (INDEPENDENT_AMBULATORY_CARE_PROVIDER_SITE_OTHER): Payer: BLUE CROSS/BLUE SHIELD | Admitting: Urgent Care

## 2015-03-10 VITALS — BP 108/68 | HR 71 | Temp 97.5°F | Resp 16 | Ht 62.5 in | Wt 125.0 lb

## 2015-03-10 DIAGNOSIS — R51 Headache: Secondary | ICD-10-CM | POA: Diagnosis not present

## 2015-03-10 DIAGNOSIS — R519 Headache, unspecified: Secondary | ICD-10-CM

## 2015-03-10 DIAGNOSIS — R0981 Nasal congestion: Secondary | ICD-10-CM

## 2015-03-10 DIAGNOSIS — R1013 Epigastric pain: Secondary | ICD-10-CM

## 2015-03-10 DIAGNOSIS — E039 Hypothyroidism, unspecified: Secondary | ICD-10-CM

## 2015-03-10 LAB — COMPREHENSIVE METABOLIC PANEL
ALBUMIN: 4.2 g/dL (ref 3.6–5.1)
ALT: 10 U/L (ref 6–29)
AST: 19 U/L (ref 10–35)
Alkaline Phosphatase: 60 U/L (ref 33–130)
BILIRUBIN TOTAL: 0.5 mg/dL (ref 0.2–1.2)
BUN: 11 mg/dL (ref 7–25)
CO2: 29 mmol/L (ref 20–31)
Calcium: 9.4 mg/dL (ref 8.6–10.4)
Chloride: 102 mmol/L (ref 98–110)
Creat: 0.61 mg/dL (ref 0.50–1.05)
GLUCOSE: 95 mg/dL (ref 65–99)
Potassium: 4.4 mmol/L (ref 3.5–5.3)
SODIUM: 140 mmol/L (ref 135–146)
Total Protein: 7.5 g/dL (ref 6.1–8.1)

## 2015-03-10 LAB — POCT CBC
Granulocyte percent: 62.1 %G (ref 37–80)
HEMATOCRIT: 41.7 % (ref 37.7–47.9)
HEMOGLOBIN: 14 g/dL (ref 12.2–16.2)
LYMPH, POC: 2.5 (ref 0.6–3.4)
MCH, POC: 28.8 pg (ref 27–31.2)
MCHC: 33.5 g/dL (ref 31.8–35.4)
MCV: 85.9 fL (ref 80–97)
MID (cbc): 0.4 (ref 0–0.9)
MPV: 6.4 fL (ref 0–99.8)
PLATELET COUNT, POC: 379 10*3/uL (ref 142–424)
POC GRANULOCYTE: 4.8 (ref 2–6.9)
POC LYMPH %: 32.7 % (ref 10–50)
POC MID %: 5.2 %M (ref 0–12)
RBC: 4.86 M/uL (ref 4.04–5.48)
RDW, POC: 12.8 %
WBC: 7.7 10*3/uL (ref 4.6–10.2)

## 2015-03-10 LAB — T3, FREE: T3, Free: 3.2 pg/mL (ref 2.3–4.2)

## 2015-03-10 LAB — TSH: TSH: 1.76 mIU/L

## 2015-03-10 LAB — T4, FREE: Free T4: 1.7 ng/dL (ref 0.8–1.8)

## 2015-03-10 NOTE — Progress Notes (Signed)
MRN: [id number] DOB: January 22, 1961  Subjective:   Audrey Schwartz is a 55 y.o. female presenting for chief complaint of Abdominal Pain  Reports ~2 week history of intermittent epigastric pain, burning sensation in her belly worse with eating. She is also having subjective fever as well, wakes up with retro-orbital eye pain, frontal headache that is relieved by mid day. She was seen here on 02/24/2015, started on Amoxicillin for sinusitis. Seen again for recheck on 03/03/2015 and started on Azithromycin due to intolerance. She admits that she has been eating very light meals. Denies chest pain, shob, wheezing, sinus pain, ear pain, ear drainage, nausea, vomiting, diarrhea, loose stools. She does not take anything for allergies but admits relief with Sudafed.  Audrey Schwartz has a current medication list which includes the following prescription(s): hydrocodone-homatropine and levothyroxine. Also is allergic to amoxicillin.  Audrey Schwartz  has a past medical history of No pertinent past medical history; Hypothyroidism; Thyroid disease; Headache(784.0); Complication of anesthesia (1997); and Bruises easily (for a long time). Also  has past surgical history that includes Cesarean section (1997) and Colonoscopy with propofol (N/A, 08/07/2013).  Objective:   Vitals: BP 108/68 mmHg  Pulse 71  Temp(Src) 97.5 F (36.4 C)  Resp 16  Ht 5' 2.5" (1.588 m)  Wt 125 lb (56.7 kg)  BMI 22.48 kg/m2  Physical Exam  Constitutional: She is oriented to person, place, and time. She appears well-developed and well-nourished.  HENT:  TM's intact bilaterally, no effusions or erythema. Nasal turbinates pink and moist, nasal passages patent. No sinus tenderness. Oropharynx clear, mucous membranes moist, dentition in good repair.  Eyes: Pupils are equal, round, and reactive to light. Right eye exhibits no discharge. Left eye exhibits no discharge. No scleral icterus.  Neck: Normal range of motion. Neck supple.  Cardiovascular:  Normal rate, regular rhythm and intact distal pulses.  Exam reveals no gallop and no friction rub.   No murmur heard. Pulmonary/Chest: No respiratory distress. She has no wheezes. She has no rales.  Abdominal: Soft. Bowel sounds are normal. She exhibits no distension and no mass. There is tenderness (epigastric).  Lymphadenopathy:    She has no cervical adenopathy.  Neurological: She is alert and oriented to person, place, and time.  Skin: Skin is warm and dry.  Flat affect, slowed speech.   Results for orders placed or performed in visit on 03/10/15 (from the past 24 hour(s))  POCT CBC     Status: None   Collection Time: 03/10/15 12:16 PM  Result Value Ref Range   WBC 7.7 4.6 - 10.2 K/uL   Lymph, poc 2.5 0.6 - 3.4   POC LYMPH PERCENT 32.7 10 - 50 %L   MID (cbc) 0.4 0 - 0.9   POC MID % 5.2 0 - 12 %M   POC Granulocyte 4.8 2 - 6.9   Granulocyte percent 62.1 37 - 80 %G   RBC 4.86 4.04 - 5.48 M/uL   Hemoglobin 14.0 12.2 - 16.2 g/dL   HCT, POC 40.9 81.1 - 47.9 %   MCV 85.9 80 - 97 fL   MCH, POC 28.8 27 - 31.2 pg   MCHC 33.5 31.8 - 35.4 g/dL   RDW, POC 91.4 %   Platelet Count, POC 379 142 - 424 K/uL   MPV 6.4 0 - 99.8 fL   Assessment and Plan :   1. Abdominal pain, epigastric - Labs pending, may be adverse effects from antibiotic course. Recommended probiotic and Zantac. Recheck in 1 week if no  improvement.  2. Hypothyroidism, unspecified hypothyroidism type - Labs pending.  3. Sinus headache 4. Sinus congestion - Suspect allergic component, recommended patient start Zyrtec with sudafed. Recheck in 1 week, consider head CT.  Wallis Bamberg, PA-C [facility] [phone] 03/10/2015 11:31 AM

## 2015-03-10 NOTE — Patient Instructions (Signed)
- Por favor tome Zyrtec (una vez al dia) con Sudafed (dos veces al dia) para alergia. - Tome un probiotic con Zantac (dos veces al dia) para su dolor de [place].   Alergias (Allergies) Vella Raring es una reaccin anormal del sistema de defensa del cuerpo (sistema inmunitario) ante una sustancia. Las Oncologist a Actuary. CULES SON LAS CAUSAS DE LAS ALERGIAS? La reaccin alrgica se produce cuando el sistema inmunitario, por equivocacin, reacciona ante una sustancia normalmente inocua, llamada alrgeno, como si fuera perjudicial. El sistema inmunitario libera anticuerpos para combatir la sustancia. Con el tiempo, los anticuerpos liberan una sustancia qumica llamada histamina en el torrente sanguneo. La liberacin de histamina tiene como fin proteger al cuerpo de la infeccin, pero tambin causa molestias. Cualquiera de estas acciones puede desencadenar una reaccin alrgica:  Comer un alrgeno.  Inhalar un alrgeno.  Tocar un alrgeno. CULES SON LAS CLASES DE ALERGIAS? Hay muchas clases de alergias. Entre las ms frecuentes, se incluyen las siguientes:  Theatre manager. Por lo general, esta clase de alergia se produce por sustancias que solo estn presentes durante determinadas estaciones, por ejemplo, el moho y el polen.  Alergias a los alimentos.  Alergias a los medicamentos.  Alergias a los insectos.  Alergias a la caspa de [employer]. CULES SON LOS SNTOMAS DE LAS ALERGIAS? Entre los posibles sntomas de la [place], se incluyen los siguientes:  Hinchazn de los labios, la cara, la [place], la boca o la garganta.  Estornudos, tos o sibilancias.  Congestin nasal.  Hormigueo en la boca.  Erupcin cutnea.  Picazn.  Zonas de piel hinchadas, rojas y que producen picazn (ronchas).  Lagrimeo.  Vmitos.  Diarrea.  Mareos.  Sensacin de desvanecimiento.  Desmayos.  Problemas para respirar o tragar.  Opresin en el  pecho.  Latidos cardacos rpidos. CMO SE DIAGNOSTICAN LAS ALERGIAS? Las [employer] se diagnostican en funcin de los antecedentes mdicos y familiares, y mediante uno o ms de estos elementos:  Pruebas cutneas.  Anlisis de [place].  Un registro de alimentos. Un registro de [employer] todos los alimentos y las bebidas que usted consume en un da, y todos los sntomas que experimenta.  Los resultados de una dieta de eliminacin. Una dieta de eliminacin implica eliminar alimentos de la dieta y luego incorporarlos nuevamente, uno a la vez, para averiguar si hay uno en particular que le cause Runner, broadcasting/film/video. CMO SE TRATAN LAS ALERGIAS? No hay una cura para las [place], pero las reacciones alrgicas pueden tratarse con medicamentos. Generalmente, las reacciones graves deben tratarse en un hospital. CMO PUEDEN PREVENIRSE LAS REACCIONES? La mejor manera de prevenir una reaccin alrgica es evitar la sustancia que le causa alergia. Las vacunas y los medicamentos para la alergia tambin pueden ayudar a prevenir las reacciones en [employer]. Las personas con Therapist, art graves pueden prevenir una reaccin potencialmente mortal llamada anafilaxis con la administracin inmediata de un medicamento despus de la exposicin al alrgeno.   Esta informacin no tiene Theme park manager el consejo del mdico. Asegrese de hacerle al mdico cualquier pregunta que tenga.   Document Released: 01/18/2005 Document Revised: 02/08/2014 Elsevier Interactive Patient Education 2016 [employer].    Dolor abdominal en adultos (Abdominal Pain, Adult) El dolor puede tener muchas causas. Normalmente la causa del dolor abdominal no es una enfermedad y Scientist, clinical (histocompatibility and immunogenetics) sin TEFL teacher. Frecuentemente puede controlarse y tratarse en casa. Su mdico le Medical sales representative examen fsico y posiblemente solicite anlisis de sangre y radiografas para ayudar a Chief Strategy Officer  la gravedad de Agricultural consultant. Sin embargo,  en [employer], debe transcurrir ms tiempo antes de que se pueda Clinical research associate una causa evidente del dolor. Antes de llegar a ese punto, es posible que su mdico no sepa si necesita ms pruebas o un tratamiento ms profundo. INSTRUCCIONES PARA EL CUIDADO EN EL HOGAR  Est atento al dolor para ver si hay cambios. Las siguientes indicaciones ayudarn a Architectural technologist que pueda sentir:  [place] solo medicamentos de venta libre o recetados, segn las indicaciones del mdico.  No tome laxantes a menos que se lo haya indicado su mdico.  Pruebe con [place] Dear dieta lquida absoluta (caldo, t o agua) segn se lo indique su mdico. Introduzca gradualmente una dieta normal, segn su tolerancia. SOLICITE ATENCIN MDICA SI:  Tiene dolor abdominal sin explicacin.  Tiene dolor abdominal relacionado con nuseas o diarrea.  Tiene dolor cuando orina o defeca.  Experimenta dolor abdominal que lo despierta de noche.  Tiene dolor abdominal que empeora o mejora cuando come alimentos.  Tiene dolor abdominal que empeora cuando come alimentos grasosos.  Tiene fiebre. SOLICITE ATENCIN MDICA DE INMEDIATO SI:   El dolor no desaparece en un plazo mximo de 2horas.  No deja de (vomitar).  El Engineer, mining se siente solo en partes del abdomen, como el lado derecho o la parte inferior izquierda del abdomen.  Evaca materia fecal sanguinolenta o negra, de aspecto alquitranado. ASEGRESE DE QUE:  Comprende estas instrucciones.  Controlar su afeccin.  Recibir ayuda de inmediato si no mejora o si empeora.   Esta informacin no tiene Theme park manager el consejo del mdico. Asegrese de hacerle al mdico cualquier pregunta que tenga.   Document Released: 01/18/2005 Document Revised: 02/08/2014 Elsevier Interactive Patient Education [employer]

## 2015-03-12 ENCOUNTER — Encounter: Payer: Self-pay | Admitting: Urgent Care

## 2015-04-03 ENCOUNTER — Other Ambulatory Visit: Payer: Self-pay

## 2015-04-03 DIAGNOSIS — Z1231 Encounter for screening mammogram for malignant neoplasm of breast: Secondary | ICD-10-CM

## 2015-04-10 ENCOUNTER — Ambulatory Visit
Admission: RE | Admit: 2015-04-10 | Discharge: 2015-04-10 | Disposition: A | Discharge status: Home / Self Care | Payer: BLUE CROSS/BLUE SHIELD | Source: Ambulatory Visit

## 2015-04-10 DIAGNOSIS — Z1231 Encounter for screening mammogram for malignant neoplasm of breast: Secondary | ICD-10-CM

## 2015-05-08 ENCOUNTER — Ambulatory Visit: Payer: BLUE CROSS/BLUE SHIELD | Attending: Internal Medicine | Admitting: Internal Medicine

## 2015-05-08 ENCOUNTER — Encounter: Payer: Self-pay | Admitting: Internal Medicine

## 2015-05-08 VITALS — BP 117/75 | HR 62 | Temp 97.4°F | Resp 16 | Ht 63.0 in | Wt 131.0 lb

## 2015-05-08 DIAGNOSIS — Z79899 Other long term (current) drug therapy: Secondary | ICD-10-CM | POA: Diagnosis not present

## 2015-05-08 DIAGNOSIS — E079 Disorder of thyroid, unspecified: Secondary | ICD-10-CM | POA: Diagnosis not present

## 2015-05-08 DIAGNOSIS — R1013 Epigastric pain: Secondary | ICD-10-CM | POA: Diagnosis not present

## 2015-05-08 LAB — T4, FREE: Free T4: 1.4 ng/dL (ref 0.8–1.8)

## 2015-05-08 LAB — TSH: TSH: 1.44 mIU/L

## 2015-05-08 MED ORDER — RANITIDINE HCL 150 MG PO CAPS: 150.0000 mg | ORAL_CAPSULE | Freq: Two times a day (BID) | ORAL | Status: DC

## 2015-05-08 NOTE — Progress Notes (Signed)
Patient's here for Thyroid check.  Patient denies any pain.

## 2015-05-08 NOTE — Patient Instructions (Signed)
Opciones de alimentos para pacientes con reflujo gastroesofágico - Adultos  (Food Choices for Gastroesophageal Reflux Disease, Adult)  Cuando se tiene reflujo gastroesofágico (ERGE), los alimentos que se ingieren y los hábitos de alimentación son muy importantes. Elegir los alimentos adecuados puede ayudar a aliviar las molestias ocasionadas por el ERGE.  ¿QUÉ PAUTAS GENERALES DEBO SEGUIR?  · Elija las frutas, los vegetales, los cereales integrales, los productos lácteos, la carne de vaca, de pescado y de ave con bajo contenido de grasas.  · Limite las grasas, como los aceites, los aderezos para ensalada, la manteca, los frutos secos y el aguacate.  · Lleve un registro de las comidas para identificar los alimentos que ocasionan síntomas.  · Evite los alimentos que le ocasionen reflujo. Pueden ser distintos para cada persona.  · Haga comidas pequeñas con frecuencia en lugar de tres comidas abundantes todos los días.  · Coma lentamente, en un clima distendido.  · Limite el consumo de alimentos fritos.  · Cocine los alimentos utilizando métodos que no sean la fritura.  · Evite el consumo alcohol.  · Evite beber grandes cantidades de líquidos con las comidas.  · Evite agacharse o recostarse hasta después de 2 o 3 horas de haber comido.  ¿QUÉ ALIMENTOS NO SE RECOMIENDAN?  Los siguientes son algunos alimentos y bebidas que pueden empeorar los síntomas:  Vegetales  Tomates. Jugo de tomate. Salsa de tomate y espagueti. Ajíes. Cebolla y ajo. Rábano picante.  Frutas  Naranjas, pomelos y limón (fruta y jugo).  Carnes  Carnes de vaca, de pescado y de ave con gran contenido de grasas. Esto incluye los perros calientes, las costillas, el jamón, la salchicha, el salame y el tocino.  Lácteos  Leche entera y leche chocolatada. Crema ácida. Crema. Mantequilla. Helados. Queso crema.   Bebidas  Café y té negro, con o sin cafeína Bebidas gaseosas o energizantes.  Condimentos  Salsa picante. Salsa barbacoa.   Dulces/postres  Chocolate y  cacao. Rosquillas. Menta y mentol.  Grasas y aceites  Alimentos con alto contenido de grasas, incluidas las papas fritas.  Otros  Vinagre. Especias picantes, como la pimienta negra, la pimienta blanca, la pimienta roja, la pimienta de cayena, el curry en polvo, los clavos de olor, el jengibre y el chile en polvo.  Los artículos mencionados arriba pueden no ser una lista completa de las bebidas y los alimentos que se deben evitar. Comuníquese con el nutricionista para recibir más información.     Esta información no tiene como fin reemplazar el consejo del médico. Asegúrese de hacerle al médico cualquier pregunta que tenga.     Document Released: 10/28/2004 Document Revised: 02/08/2014  Elsevier Interactive Patient Education ©2016 Elsevier Inc.

## 2015-05-08 NOTE — Progress Notes (Signed)
Patient ID: Audrey Schwartz, female   DOB: May 01, 1960, 55 y.o.   MRN: [id number]  CC: thyroid disease  HPI: Audrey Schwartz is a 55 y.o. female here today for a follow up visit.  Patient has past medical history of thyroid disease. Patient reports that she has been taking the same dose of Synthroid for a few years. She has no thyroid symptoms today. Abdominal pain for the past 3 months. Pain in epigastric region that is described as a burning sensation that often makes her feel like she needs to vomit. Pain is worse after meals.   Allergies  Allergen Reactions  . Amoxicillin Nausea And Vomiting    Has patient had a PCN reaction causing immediate rash, facial/tongue/throat swelling, SOB or lightheadedness with hypotension: yes Has patient had a PCN reaction causing severe rash involving mucus membranes or skin necrosis: no Has patient had a PCN reaction that required hospitalization : no Has patient had a PCN reaction occurring within the last 10 years: yes If all of the above answers are "NO", then may proceed with Cephalosporin use.    Past Medical History  Diagnosis Date  . No pertinent past medical history     PT IS A POOR HISTORIAN   . Hypothyroidism   . Thyroid disease   . Headache(784.0)     and nausea occasionally  . Complication of anesthesia 1997    slow to awaken after c section due to dentures accidentally left in  . Bruises easily for a long time    saw doctor, no problem found   Current Outpatient Prescriptions on File Prior to Visit  Medication Sig Dispense Refill  . HYDROcodone-homatropine (HYCODAN) 5-1.5 MG/5ML syrup Take 5 mLs by mouth at bedtime as needed. (Patient taking differently: Take 5 mLs by mouth at bedtime as needed for cough. ) 120 mL 0  . levothyroxine (SYNTHROID, LEVOTHROID) 88 MCG tablet Take 88 mcg by mouth daily before breakfast.     No current facility-administered medications on file prior to visit.   No family history on file. Social  History   Social History  . Marital Status: Single    Spouse Name: N/A  . Number of Children: N/A  . Years of Education: N/A   Occupational History  . Not on file.   Social History Main Topics  . Smoking status: Never Smoker   . Smokeless tobacco: Never Used  . Alcohol Use: No  . Drug Use: No  . Sexual Activity: No   Other Topics Concern  . Not on file   Social History Narrative    Review of Systems: Other than what is stated in HPI, all other systems are negative.   Objective:   Filed Vitals:   05/08/15 1231  BP: 117/75  Pulse: 62  Temp: 97.4 F (36.3 C)  Resp: 16    Physical Exam  Neck: No thyromegaly present.  Cardiovascular: Normal rate, regular rhythm and normal heart sounds.   Pulmonary/Chest: Effort normal and breath sounds normal.  Abdominal: Soft. Bowel sounds are normal. There is tenderness (epigastric).  Skin: Skin is warm and dry.  Psychiatric: She has a normal mood and affect.     Lab Results  Component Value Date   WBC 7.7 03/10/2015   HGB 14.0 03/10/2015   HCT 41.7 03/10/2015   MCV 85.9 03/10/2015   PLT 223 11/07/2014   Lab Results  Component Value Date   CREATININE 0.61 03/10/2015   BUN 11 03/10/2015   NA 140 03/10/2015  K 4.4 03/10/2015   CL 102 03/10/2015   CO2 29 03/10/2015    Lab Results  Component Value Date   HGBA1C 6.3* 02/26/2009   Lipid Panel     Component Value Date/Time   CHOL 191 01/07/2014 1025   TRIG 82 01/07/2014 1025   HDL 54 01/07/2014 1025   CHOLHDL 3.5 01/07/2014 1025   VLDL 16 01/07/2014 1025   LDLCALC 121* 01/07/2014 1025       Assessment and plan:   Audrey AmenJulia was seen today for hypothyroidism.  Diagnoses and all orders for this visit:  Abdominal pain, epigastric -     Begin ranitidine (ZANTAC) 150 MG capsule; Take 1 capsule (150 mg total) by mouth 2 (two) times daily before a meal. Discussed diet and weight with patient relating to acid reflux.  Went over things that may exacerbate acid reflux  such as tomatoes, spicy foods, coffee, carbonated beverages, chocolates, etc.  Advised patient to avoid laying down at least two hours after meals and sleep with HOB elevated.   Thyroid disease -     TSH -     T4, Free Will send refills once I have lab test back  Interpreter was used to communicate directly with patient for the entire encounter including providing detailed patient instructions.   Return in about 6 months (around 11/07/2015).       Ambrose FinlandValerie A Tillmon Kisling, NP-C [facility] [phone] 05/08/2015, 12:28 PM

## 2015-05-09 ENCOUNTER — Other Ambulatory Visit: Payer: Self-pay

## 2015-05-09 ENCOUNTER — Telehealth: Payer: Self-pay

## 2015-05-09 MED ORDER — LEVOTHYROXINE SODIUM 88 MCG PO TABS: 88.0000 ug | ORAL_TABLET | Freq: Every day | ORAL | Status: DC

## 2015-05-09 NOTE — Telephone Encounter (Signed)
CMA called [employer] interpreter and spoke with Almira CoasterGina [phone]. Interpreter left a message for the patient to return my call.  Note to patient:  Please let patient know that her Rx for Synthroid was sent to our Pharmacy.

## 2015-05-09 NOTE — Telephone Encounter (Signed)
Advised patient of Rx sent to pharmacy

## 2015-10-30 ENCOUNTER — Ambulatory Visit: Payer: BLUE CROSS/BLUE SHIELD | Attending: Family Medicine | Admitting: Family Medicine

## 2015-10-30 ENCOUNTER — Encounter: Payer: Self-pay | Admitting: Family Medicine

## 2015-10-30 VITALS — BP 95/60 | HR 63 | Temp 98.0°F | Ht 63.0 in | Wt 131.2 lb

## 2015-10-30 DIAGNOSIS — R5383 Other fatigue: Secondary | ICD-10-CM | POA: Diagnosis not present

## 2015-10-30 DIAGNOSIS — K219 Gastro-esophageal reflux disease without esophagitis: Secondary | ICD-10-CM | POA: Diagnosis not present

## 2015-10-30 DIAGNOSIS — Z23 Encounter for immunization: Secondary | ICD-10-CM | POA: Diagnosis not present

## 2015-10-30 DIAGNOSIS — E039 Hypothyroidism, unspecified: Secondary | ICD-10-CM | POA: Insufficient documentation

## 2015-10-30 DIAGNOSIS — Z13228 Encounter for screening for other metabolic disorders: Secondary | ICD-10-CM | POA: Diagnosis not present

## 2015-10-30 DIAGNOSIS — R1013 Epigastric pain: Secondary | ICD-10-CM

## 2015-10-30 DIAGNOSIS — Z1211 Encounter for screening for malignant neoplasm of colon: Secondary | ICD-10-CM | POA: Insufficient documentation

## 2015-10-30 DIAGNOSIS — R0981 Nasal congestion: Secondary | ICD-10-CM | POA: Insufficient documentation

## 2015-10-30 DIAGNOSIS — Z79899 Other long term (current) drug therapy: Secondary | ICD-10-CM | POA: Diagnosis not present

## 2015-10-30 DIAGNOSIS — E038 Other specified hypothyroidism: Secondary | ICD-10-CM | POA: Diagnosis not present

## 2015-10-30 LAB — CBC WITH DIFFERENTIAL/PLATELET
BASOS PCT: 0 %
Basophils Absolute: 0 cells/uL (ref 0–200)
EOS ABS: 126 {cells}/uL (ref 15–500)
EOS PCT: 2 %
HCT: 39.9 % (ref 35.0–45.0)
Hemoglobin: 13.6 g/dL (ref 11.7–15.5)
LYMPHS PCT: 43 %
Lymphs Abs: 2709 cells/uL (ref 850–3900)
MCH: 29 pg (ref 27.0–33.0)
MCHC: 34.1 g/dL (ref 32.0–36.0)
MCV: 85.1 fL (ref 80.0–100.0)
MONOS PCT: 8 %
MPV: 10.2 fL (ref 7.5–12.5)
Monocytes Absolute: 504 cells/uL (ref 200–950)
Neutro Abs: 2961 cells/uL (ref 1500–7800)
Neutrophils Relative %: 47 %
PLATELETS: 253 10*3/uL (ref 140–400)
RBC: 4.69 MIL/uL (ref 3.80–5.10)
RDW: 13.3 % (ref 11.0–15.0)
WBC: 6.3 10*3/uL (ref 3.8–10.8)

## 2015-10-30 LAB — COMPLETE METABOLIC PANEL WITH GFR
ALBUMIN: 4.1 g/dL (ref 3.6–5.1)
ALK PHOS: 62 U/L (ref 33–130)
ALT: 8 U/L (ref 6–29)
AST: 20 U/L (ref 10–35)
BILIRUBIN TOTAL: 0.4 mg/dL (ref 0.2–1.2)
BUN: 14 mg/dL (ref 7–25)
CALCIUM: 9 mg/dL (ref 8.6–10.4)
CO2: 26 mmol/L (ref 20–31)
Chloride: 104 mmol/L (ref 98–110)
Creat: 0.64 mg/dL (ref 0.50–1.05)
GLUCOSE: 92 mg/dL (ref 65–99)
POTASSIUM: 4.7 mmol/L (ref 3.5–5.3)
Sodium: 138 mmol/L (ref 135–146)
TOTAL PROTEIN: 6.8 g/dL (ref 6.1–8.1)

## 2015-10-30 LAB — LIPID PANEL
CHOLESTEROL: 175 mg/dL (ref 125–200)
HDL: 52 mg/dL (ref >=46)
LDL Cholesterol: 108 mg/dL (ref <130)
TRIGLYCERIDES: 74 mg/dL (ref <150)
Total CHOL/HDL Ratio: 3.4 Ratio (ref <=5.0)
VLDL: 15 mg/dL (ref <30)

## 2015-10-30 LAB — TSH: TSH: 1.41 m[IU]/L

## 2015-10-30 MED ORDER — RANITIDINE HCL 150 MG PO CAPS: 150.0000 mg | ORAL_CAPSULE | Freq: Two times a day (BID) | ORAL | 4 refills | Status: DC

## 2015-10-30 MED ORDER — CETIRIZINE HCL 10 MG PO TABS: 10.0000 mg | ORAL_TABLET | Freq: Every day | ORAL | 1 refills | Status: DC

## 2015-10-30 NOTE — Progress Notes (Signed)
Subjective:  Patient ID: Audrey Schwartz, female    DOB: 1960-04-12  Age: 55 y.o. MRN: [id number]  CC: Fever (yesterday); Hypothyroidism; Fatigue; and gastritis   HPI Audrey Schwartz is a 55 year old female with a history of hypothyroidism, GERD who comes into the clinic to establish care with me.  She was previously followed by the nurse practitioner who is no longer with the clinic. Endorses compliance with levothyroxine and uses Ranitidine as needed for abdominal pain. Today she complains of fatigue of 2 weeks' duration which is intermittent; endorses occasional dizziness whenever she has sinus congestion which she noticed yesterday. Denies syncope or near syncope. She is postmenopausal and does not have any vaginal bleeding and denies hematochezia.  Has some nasal congestion and head congestion but no fevers, chest pains, shortness of breath.  GERD symptoms are controlled on ranitidine. She is willing to receive the flu shot today.  Past Medical History:  Diagnosis Date  . Bruises easily for a long time   saw doctor, no problem found  . Complication of anesthesia 1997   slow to awaken after c section due to dentures accidentally left in  . Headache(784.0)    and nausea occasionally  . Hypothyroidism   . No pertinent past medical history    PT IS A POOR HISTORIAN   . Thyroid disease     Past Surgical History:  Procedure Laterality Date  . CESAREAN SECTION  1997  . COLONOSCOPY WITH PROPOFOL N/A 08/07/2013   Procedure: COLONOSCOPY WITH PROPOFOL;  Surgeon: Charolett BumpersMartin K Johnson, MD;  Location: [facility];  Service: Endoscopy;  Laterality: N/A;     Outpatient Medications Prior to Visit  Medication Sig Dispense Refill  . levothyroxine (SYNTHROID, LEVOTHROID) 88 MCG tablet Take 1 tablet (88 mcg total) by mouth daily before breakfast. 30 tablet 5  . ranitidine (ZANTAC) 150 MG capsule Take 1 capsule (150 mg total) by mouth 2 (two) times daily before a meal. 60 capsule 4     No facility-administered medications prior to visit.     ROS Review of Systems  Constitutional: Positive for fatigue. Negative for activity change and appetite change.  HENT: Positive for congestion. Negative for sinus pressure and sore throat.   Eyes: Negative for visual disturbance.  Respiratory: Negative for cough, chest tightness, shortness of breath and wheezing.   Cardiovascular: Negative for chest pain and palpitations.  Gastrointestinal: Negative for abdominal distention, abdominal pain and constipation.  Endocrine: Negative for polydipsia.  Genitourinary: Negative for dysuria and frequency.  Musculoskeletal: Negative for arthralgias and back pain.  Skin: Negative for rash.  Neurological: Negative for tremors, light-headedness and numbness.  Hematological: Does not bruise/bleed easily.  Psychiatric/Behavioral: Negative for agitation and behavioral problems.    Objective:  BP 95/60 (BP Location: Right Arm, Patient Position: Sitting, Cuff Size: Small)   Pulse 63   Temp 98 F (36.7 C) (Oral)   Ht 5\' 3"  (1.6 m)   Wt 131 lb 3.2 oz (59.5 kg)   SpO2 98%   BMI 23.24 kg/m   BP/Weight 10/30/2015 05/08/2015 03/10/2015  Systolic BP 95 117 108  Diastolic BP 60 75 68  Wt. (Lbs) 131.2 131 125  BMI 23.24 23.21 22.48      Physical Exam  Constitutional: She is oriented to person, place, and time. She appears well-developed and well-nourished.  Cardiovascular: Normal rate, normal heart sounds and intact distal pulses.   No murmur heard. Pulmonary/Chest: Effort normal and breath sounds normal. She has no wheezes. She has no rales. She  exhibits no tenderness.  Abdominal: Soft. Bowel sounds are normal. She exhibits no distension and no mass. There is no tenderness.  Musculoskeletal: Normal range of motion.  Neurological: She is alert and oriented to person, place, and time.     Assessment & Plan:   1. Other specified hypothyroidism Controlled; last TSH from 05/2015 was  normal We'll refill level thyroxine after TSH results are obtained. - TSH  2. Other fatigue Could be multifactorial Will need to exclude fatigue of thyroid origin and will also need to exclude anemia Could possibly be related to sinus symptoms - CBC with Differential/Platelet  3. Abdominal pain, epigastric Versus GERD - ranitidine (ZANTAC) 150 MG capsule; Take 1 capsule (150 mg total) by mouth 2 (two) times daily before a meal.  Dispense: 60 capsule; Refill: 4  4. Screening for metabolic disorder - Lipid panel - COMPLETE METABOLIC PANEL WITH GFR  5. Sinus congestion - cetirizine (ZYRTEC) 10 MG tablet; Take 1 tablet (10 mg total) by mouth daily.  Dispense: 30 tablet; Refill: 1   Meds ordered this encounter  Medications  . ranitidine (ZANTAC) 150 MG capsule    Sig: Take 1 capsule (150 mg total) by mouth 2 (two) times daily before a meal.    Dispense:  60 capsule    Refill:  4  . cetirizine (ZYRTEC) 10 MG tablet    Sig: Take 1 tablet (10 mg total) by mouth daily.    Dispense:  30 tablet    Refill:  1    Follow-up: Return in about 6 months (around 04/28/2016).   Jaclyn Shaggy MD

## 2015-10-30 NOTE — Progress Notes (Signed)
Very fatigued Medication refill ? Not sure she should stay on ranitidine

## 2015-11-04 ENCOUNTER — Telehealth: Payer: Self-pay

## 2015-11-04 NOTE — Telephone Encounter (Signed)
-----   Message from Jaclyn ShaggyEnobong Amao, MD sent at 10/31/2015  8:38 AM EDT ----- Please inform the patient that labs are normal. Thank you.

## 2015-11-04 NOTE — Telephone Encounter (Signed)
Writer called patient through [employer] and a VM was left for patient to return call to discuss lab results.

## 2015-11-05 ENCOUNTER — Telehealth: Payer: Self-pay | Admitting: Internal Medicine

## 2015-11-05 NOTE — Telephone Encounter (Signed)
Patient received labs results. Patient understood.

## 2015-11-10 ENCOUNTER — Telehealth: Payer: Self-pay

## 2015-11-10 NOTE — Telephone Encounter (Signed)
-----   Message from Jaclyn ShaggyEnobong Amao, MD sent at 10/31/2015  8:38 AM EDT ----- Please inform the patient that labs are normal. Thank you.

## 2015-11-10 NOTE — Telephone Encounter (Signed)
Writer called patient through [employer] per Dr. Venetia NightAmao.  Patient stated understanding that her results all came back normal.

## 2015-11-20 ENCOUNTER — Other Ambulatory Visit: Payer: Self-pay | Admitting: Internal Medicine

## 2016-02-26 ENCOUNTER — Telehealth: Payer: Self-pay | Admitting: Family Medicine

## 2016-02-26 MED ORDER — LEVOTHYROXINE SODIUM 88 MCG PO TABS: ORAL_TABLET | ORAL | 3 refills | Status: DC

## 2016-02-26 NOTE — Telephone Encounter (Signed)
Patient is requesting medication refill for levothyroxine...please send to [facility] [address].  Please follow up

## 2016-02-26 NOTE — Telephone Encounter (Signed)
Requested medication refilled

## 2016-04-30 ENCOUNTER — Other Ambulatory Visit: Payer: Self-pay | Admitting: Internal Medicine

## 2016-04-30 ENCOUNTER — Ambulatory Visit
Admission: RE | Admit: 2016-04-30 | Discharge: 2016-04-30 | Disposition: A | Discharge status: Home / Self Care | Payer: BLUE CROSS/BLUE SHIELD | Source: Ambulatory Visit | Attending: Family Medicine | Admitting: Family Medicine

## 2016-04-30 DIAGNOSIS — Z1231 Encounter for screening mammogram for malignant neoplasm of breast: Secondary | ICD-10-CM

## 2016-06-24 ENCOUNTER — Encounter: Payer: Self-pay | Admitting: Family Medicine

## 2016-06-24 ENCOUNTER — Ambulatory Visit: Payer: BLUE CROSS/BLUE SHIELD | Attending: Family Medicine | Admitting: Family Medicine

## 2016-06-24 VITALS — BP 96/61 | HR 56 | Temp 97.7°F | Resp 18 | Ht 61.0 in | Wt 129.0 lb

## 2016-06-24 DIAGNOSIS — E038 Other specified hypothyroidism: Secondary | ICD-10-CM

## 2016-06-24 DIAGNOSIS — R05 Cough: Secondary | ICD-10-CM | POA: Diagnosis not present

## 2016-06-24 DIAGNOSIS — Z1159 Encounter for screening for other viral diseases: Secondary | ICD-10-CM | POA: Diagnosis not present

## 2016-06-24 DIAGNOSIS — K219 Gastro-esophageal reflux disease without esophagitis: Secondary | ICD-10-CM | POA: Insufficient documentation

## 2016-06-24 DIAGNOSIS — Z13228 Encounter for screening for other metabolic disorders: Secondary | ICD-10-CM | POA: Diagnosis not present

## 2016-06-24 DIAGNOSIS — K2960 Other gastritis without bleeding: Secondary | ICD-10-CM | POA: Diagnosis not present

## 2016-06-24 DIAGNOSIS — Z79899 Other long term (current) drug therapy: Secondary | ICD-10-CM | POA: Diagnosis not present

## 2016-06-24 DIAGNOSIS — R112 Nausea with vomiting, unspecified: Secondary | ICD-10-CM | POA: Diagnosis not present

## 2016-06-24 DIAGNOSIS — E039 Hypothyroidism, unspecified: Secondary | ICD-10-CM | POA: Diagnosis present

## 2016-06-24 DIAGNOSIS — R059 Cough, unspecified: Secondary | ICD-10-CM

## 2016-06-24 MED ORDER — CETIRIZINE HCL 10 MG PO TABS: 10.0000 mg | ORAL_TABLET | Freq: Every day | ORAL | 1 refills | Status: DC

## 2016-06-24 MED ORDER — LEVOTHYROXINE SODIUM 88 MCG PO TABS: ORAL_TABLET | ORAL | 6 refills | Status: DC

## 2016-06-24 MED ORDER — OMEPRAZOLE 20 MG PO CPDR: 20.0000 mg | DELAYED_RELEASE_CAPSULE | Freq: Every day | ORAL | 6 refills | Status: DC

## 2016-06-24 NOTE — Progress Notes (Signed)
Patient is here for FU Hypothyroidism  Patient has eaten today. Patient has not taken medication today.  Patient request a refill on Levothyroxine.

## 2016-06-24 NOTE — Patient Instructions (Signed)
Gastritis en los adultos  (Gastritis, Adult)  La gastritis es la irritación del estómago. Hay dos tipos de gastritis:  · Gastritis aguda. Este tipo aparece de manera repentina.  · Gastritis crónica. Este tipo dura mucho tiempo.  La gastritis aparece cuando la membrana que recubre el estómago se debilita o se daña. Sin tratamiento, la gastritis puede causar hemorragias y úlceras estomacales.  CAUSAS  Esta afección puede ser causada por lo siguiente:  · Una infección.  · Beber alcohol en exceso.  · Ciertos medicamentos.  · Tener demasiada cantidad de ácido en el estómago.  · Una enfermedad de los intestinos o del estómago.  · Estrés.  SÍNTOMAS  Los síntomas de esta afección incluyen lo siguiente:  · Dolor o ardor en la parte superior del abdomen.  · Náuseas.  · Vómitos.  · Sensación molesta de distensión después de comer.  En algunos casos no hay síntomas.  DIAGNÓSTICO  Esta afección se puede diagnosticar a través de lo siguiente:  · Una descripción de los síntomas.  · Un examen físico.  · Estudios. Estos pueden incluir los siguientes:  ? Análisis de sangre.  ? Pruebas de materia fecal.  ? Una prueba en la que se introduce un instrumento delgado y flexible que tiene una luz y una cámara en la punta a través del esófago y hacia el estómago (endoscopia superior).  ? Una prueba en la que se toma una muestra de tejido para analizarlo (biopsia).  TRATAMIENTO  Esta afección puede tratarse con medicamentos. Si la afección es causada por una infección bacteriana, pueden darle antibióticos. Si es causada por demasiada cantidad de ácido en el estómago, pueden darle medicamentos llamados bloqueadores H2, inhibidores de la bomba de protones o antiácidos. El tratamiento también puede incluir la suspensión del uso de ciertos medicamentos, como la aspirina, el ibuprofeno u otros antiinflamatorios no esteroides (AINE).  INSTRUCCIONES PARA EL CUIDADO EN EL HOGAR  · Tome los medicamentos de venta libre y los recetados solamente como se  lo haya indicado el médico.  · Si le recetaron un antibiótico, tómelo como se lo haya indicado el médico. No deje de tomar los antibióticos aunque comience a sentirse mejor.  · Beba suficiente líquido para mantener la orina clara o de color amarillo pálido.  · Haga varias comidas pequeñas y frecuentes durante el día en lugar de comidas abundantes.    SOLICITE ATENCIÓN MÉDICA SI:  · Los síntomas empeoran.  · Los síntomas regresan después del tratamiento.    SOLICITE ATENCIÓN MÉDICA DE INMEDIATO SI:  · Vomita sangre de color rojo brillante o una sustancia similar a los granos de café.  · La materia fecal es negra o de color rojo oscuro.  · No puede retener los líquidos.  · El dolor abdominal empeora.  · Tiene fiebre.  · No mejora luego de 1 semana.    Esta información no tiene como fin reemplazar el consejo del médico. Asegúrese de hacerle al médico cualquier pregunta que tenga.  Document Released: 10/28/2004 Document Revised: 10/09/2014 Document Reviewed: 10/12/2014  Elsevier Interactive Patient Education © 2017 Elsevier Inc.

## 2016-06-24 NOTE — Progress Notes (Signed)
Subjective:  Patient ID: Audrey Schwartz, female    DOB: 14-Apr-1960  Age: 56 y.o. MRN: [id number]  CC: Hypothyroidism   HPI Audrey Schwartz is a 56 year old female with a history of hypothyroidism, GERD who comes into the clinic for a follow-up visit.  She endorses compliance with her levothyroxine.  Complains of nausea and vomiting which are intermittent but denies abdominal pain; review of her chart indicates she should be on ranitidine which she has not been taking. Symptoms are worse when she takes coffee.  Also complains of a dry cough which occurs whenever she has a cold which she doesn't this time she states. Denies shortness of breath, wheezing or chest pain and has no pedal edema. She does not use any over-the-counter medications.   Past Medical History:  Diagnosis Date  . Bruises easily for a long time   saw doctor, no problem found  . Complication of anesthesia 1997   slow to awaken after c section due to dentures accidentally left in  . Headache(784.0)    and nausea occasionally  . Hypothyroidism   . No pertinent past medical history    PT IS A POOR HISTORIAN   . Thyroid disease     Past Surgical History:  Procedure Laterality Date  . CESAREAN SECTION  1997  . COLONOSCOPY WITH PROPOFOL N/A 08/07/2013   Procedure: COLONOSCOPY WITH PROPOFOL;  Surgeon: Garlan Fair, MD;  Location: [facility];  Service: Endoscopy;  Laterality: N/A;    Outpatient Medications Prior to Visit  Medication Sig Dispense Refill  . levothyroxine (SYNTHROID, LEVOTHROID) 88 MCG tablet TAKE 1 TABLET BY MOUTH EVERY DAY BEFORE BREAKFAST 30 tablet 3  . cetirizine (ZYRTEC) 10 MG tablet Take 1 tablet (10 mg total) by mouth daily. (Patient not taking: Reported on 06/24/2016) 30 tablet 1  . ranitidine (ZANTAC) 150 MG capsule Take 1 capsule (150 mg total) by mouth 2 (two) times daily before a meal. (Patient not taking: Reported on 06/24/2016) 60 capsule 4   No facility-administered  medications prior to visit.     ROS Review of Systems  Constitutional: Negative for activity change, appetite change and fatigue.  HENT: Negative for congestion, sinus pressure and sore throat.   Eyes: Negative for visual disturbance.  Respiratory: Positive for cough. Negative for chest tightness, shortness of breath and wheezing.   Cardiovascular: Negative for chest pain and palpitations.  Gastrointestinal: Positive for nausea and vomiting. Negative for abdominal distention, abdominal pain and constipation.  Endocrine: Negative for polydipsia.  Genitourinary: Negative for dysuria and frequency.  Musculoskeletal: Negative for arthralgias and back pain.  Skin: Negative for rash.  Neurological: Negative for tremors, light-headedness and numbness.  Hematological: Does not bruise/bleed easily.  Psychiatric/Behavioral: Negative for agitation and behavioral problems.    Objective:  BP 96/61 (BP Location: Left Arm, Patient Position: Sitting, Cuff Size: Normal)   Pulse (!) 56   Temp 97.7 F (36.5 C) (Oral)   Resp 18   Ht _0  (1.549 m)   Wt 129 lb (58.5 kg)   SpO2 98%   BMI 24.37 kg/m   BP/Weight 06/24/2016 0/30/0923 3/0/0762  Systolic BP 96 95 263  Diastolic BP 61 60 75  Wt. (Lbs) 129 131.2 131  BMI 24.37 23.24 23.21      Physical Exam  Constitutional: She is oriented to person, place, and time. She appears well-developed and well-nourished.  Cardiovascular: Normal heart sounds and intact distal pulses.  Bradycardia present.   No murmur heard. Pulmonary/Chest: Effort normal and breath  sounds normal. She has no wheezes. She has no rales. She exhibits no tenderness.  Abdominal: Soft. Bowel sounds are normal. She exhibits no distension and no mass. There is no tenderness.  Musculoskeletal: Normal range of motion.  Neurological: She is alert and oriented to person, place, and time.  Skin: Skin is warm and dry.  Psychiatric: She has a normal mood and affect.    CMP Latest Ref  Rng & Units 10/30/2015 03/10/2015 12/01/2012  Glucose 65 - 99 mg/dL 92 95 101(H)  BUN 7 - 25 mg/dL _0 Creatinine 0.50 - 1.05 mg/dL 0.64 0.61 0.63  Sodium 135 - 146 mmol/L 138 140 137  Potassium 3.5 - 5.3 mmol/L 4.7 4.4 4.2  Chloride 98 - 110 mmol/L 104 102 102  CO2 20 - 31 mmol/L _1 Calcium 8.6 - 10.4 mg/dL 9.0 9.4 9.6  Total Protein 6.1 - 8.1 g/dL 6.8 7.5 7.4  Total Bilirubin 0.2 - 1.2 mg/dL 0.4 0.5 0.6  Alkaline Phos 33 - 130 U/L 62 60 79  AST 10 - 35 U/L _2 ALT 6 - 29 U/L 8 10 <8   Lipid Panel     Component Value Date/Time   CHOL 175 10/30/2015 1004   TRIG 74 10/30/2015 1004   HDL 52 10/30/2015 1004   CHOLHDL 3.4 10/30/2015 1004   VLDL 15 10/30/2015 1004   LDLCALC 108 10/30/2015 1004     Assessment & Plan:   1. Cough Likely from postnasal drip - cetirizine (ZYRTEC) 10 MG tablet; Take 1 tablet (10 mg total) by mouth daily.  Dispense: 30 tablet; Refill: 1  2. Other specified hypothyroidism  levothyroxine (SYNTHROID, LEVOTHROID) 88 MCG tablet; TAKE 1 TABLET BY MOUTH EVERY DAY BEFORE BREAKFAST  Dispense: 30 tablet; Refill: 6 - TSH  3. Screening for metabolic disorder - Lipid panel - CMP14+EGFR  4. Need for hepatitis C screening test - Hepatitis c antibody (reflex)  5. Reflux gastritis Could explain nausea and vomiting Avoid late meals - omeprazole (PRILOSEC) 20 MG capsule; Take 1 capsule (20 mg total) by mouth daily.  Dispense: 30 capsule; Refill: 6   Meds ordered this encounter  Medications  . levothyroxine (SYNTHROID, LEVOTHROID) 88 MCG tablet    Sig: TAKE 1 TABLET BY MOUTH EVERY DAY BEFORE BREAKFAST    Dispense:  30 tablet    Refill:  6  . omeprazole (PRILOSEC) 20 MG capsule    Sig: Take 1 capsule (20 mg total) by mouth daily.    Dispense:  30 capsule    Refill:  6  . cetirizine (ZYRTEC) 10 MG tablet    Sig: Take 1 tablet (10 mg total) by mouth daily.    Dispense:  30 tablet    Refill:  1    Follow-up: Return in about 6 months  (around 12/25/2016) for follow up of Hypothyroidism.   Arnoldo Morale MD

## 2016-06-25 ENCOUNTER — Telehealth: Payer: Self-pay | Admitting: *Deleted

## 2016-06-25 LAB — CMP14+EGFR
ALBUMIN: 4.2 g/dL (ref 3.5–5.5)
ALT: 9 IU/L (ref 0–32)
AST: 21 IU/L (ref 0–40)
Albumin/Globulin Ratio: 1.6 (ref 1.2–2.2)
Alkaline Phosphatase: 74 IU/L (ref 39–117)
BILIRUBIN TOTAL: 0.3 mg/dL (ref 0.0–1.2)
BUN / CREAT RATIO: 20 (ref 9–23)
BUN: 16 mg/dL (ref 6–24)
CHLORIDE: 100 mmol/L (ref 96–106)
CO2: 27 mmol/L (ref 18–29)
CREATININE: 0.8 mg/dL (ref 0.57–1.00)
Calcium: 9.1 mg/dL (ref 8.7–10.2)
GFR calc non Af Amer: 82 mL/min/{1.73_m2} (ref >59)
GFR, EST AFRICAN AMERICAN: 94 mL/min/{1.73_m2} (ref >59)
GLUCOSE: 94 mg/dL (ref 65–99)
Globulin, Total: 2.7 g/dL (ref 1.5–4.5)
Potassium: 4.1 mmol/L (ref 3.5–5.2)
Sodium: 139 mmol/L (ref 134–144)
TOTAL PROTEIN: 6.9 g/dL (ref 6.0–8.5)

## 2016-06-25 LAB — LIPID PANEL
CHOL/HDL RATIO: 3.6 ratio (ref 0.0–4.4)
Cholesterol, Total: 180 mg/dL (ref 100–199)
HDL: 50 mg/dL (ref >39)
LDL Calculated: 109 mg/dL — ABNORMAL HIGH (ref 0–99)
Triglycerides: 103 mg/dL (ref 0–149)
VLDL Cholesterol Cal: 21 mg/dL (ref 5–40)

## 2016-06-25 LAB — TSH: TSH: 1.72 u[IU]/mL (ref 0.450–4.500)

## 2016-06-25 LAB — HCV COMMENT:

## 2016-06-25 LAB — HEPATITIS C ANTIBODY (REFLEX): HCV Ab: 0.1 s/co ratio (ref 0.0–0.9)

## 2016-06-25 NOTE — Telephone Encounter (Signed)
Patient verified DOB Patient is aware of blood work being normal. Patient expressed her understanding and had no further questions.

## 2016-06-25 NOTE — Telephone Encounter (Signed)
-----   Message from Jaclyn ShaggyEnobong Amao, MD sent at 06/25/2016  1:35 PM EDT ----- Please inform the patient that labs are normal. Thank you.

## 2016-11-01 IMAGING — CR DG CHEST 2V
2 series · 2 of 2 positions shown · non-contrast
Comparison: None.

CLINICAL DATA: Cough; Fever; Chest pain.

EXAM:
CHEST  2 VIEW

[PA]
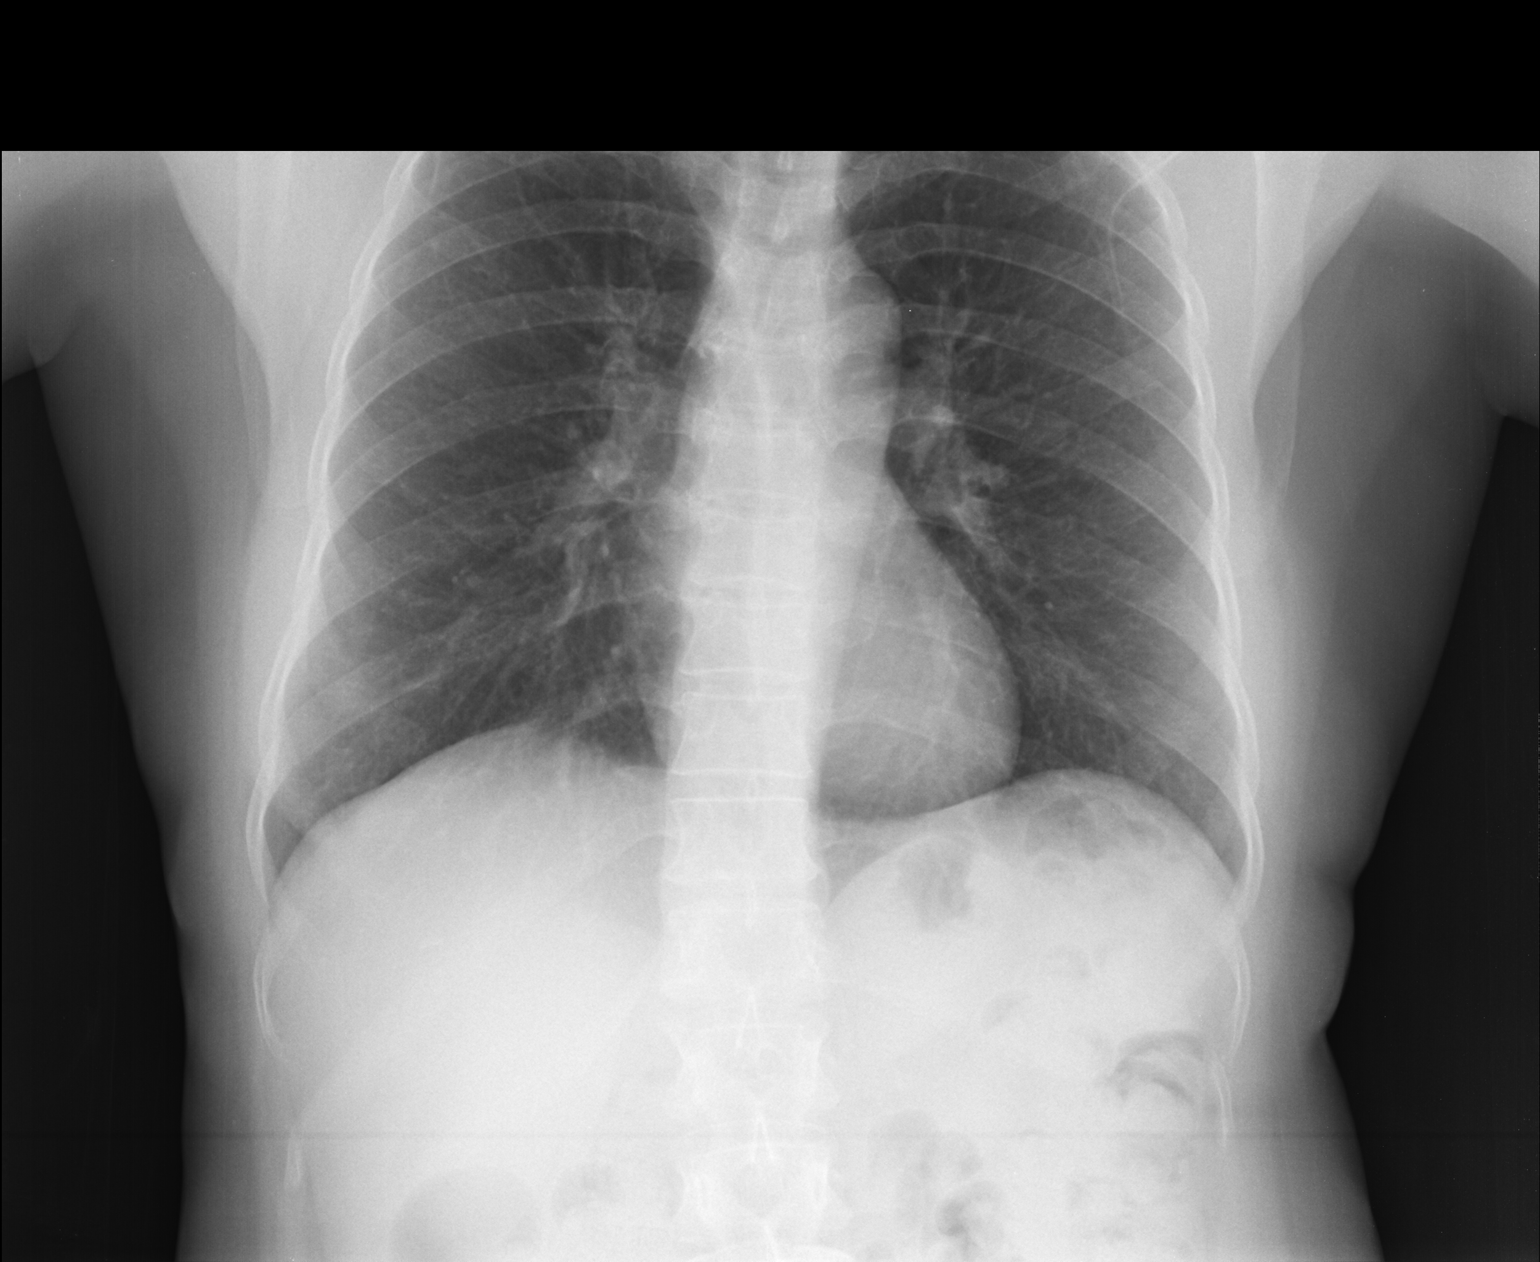

[lateral]
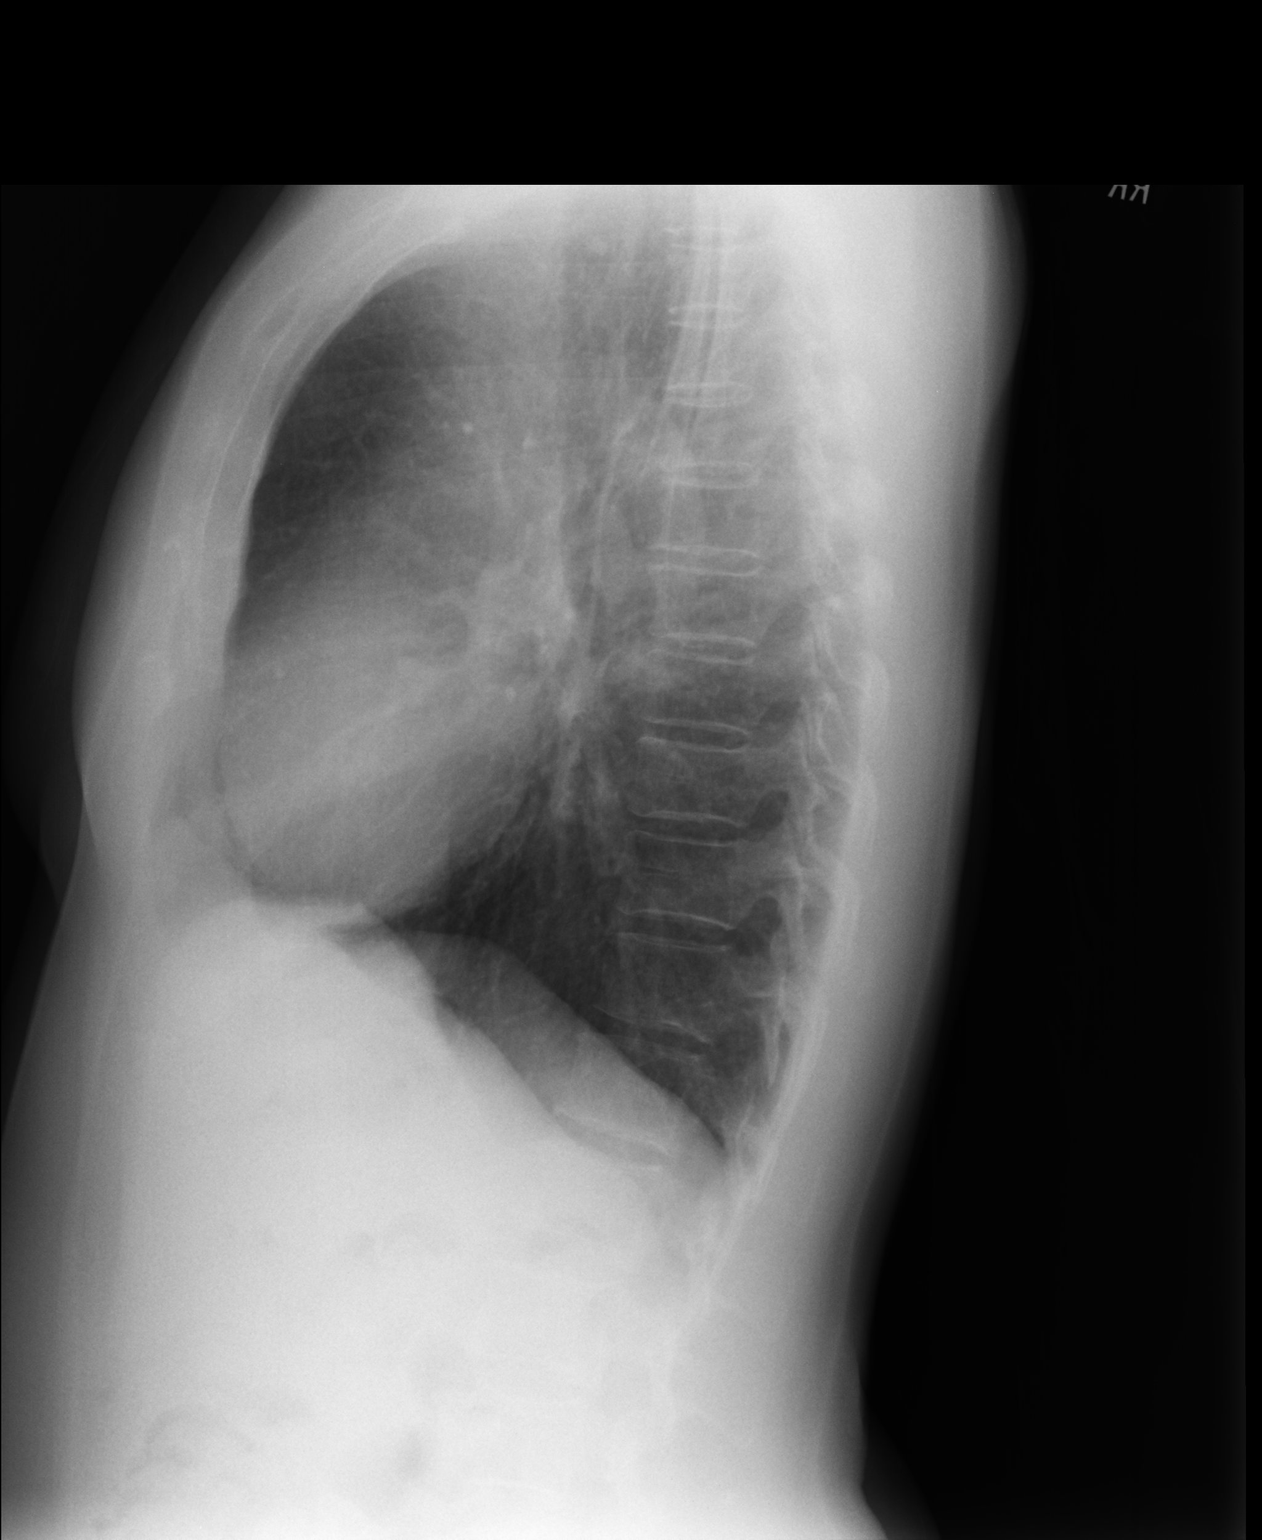

[2 of 2 positions shown; findings below may reference images not displayed]

FINDINGS: The heart size and mediastinal contours are within normal limits.
Both lungs are clear. The visualized skeletal structures are
unremarkable. Calcified granuloma is identified in the right lung
apex.
IMPRESSION: No active cardiopulmonary disease.

## 2017-01-13 ENCOUNTER — Ambulatory Visit: Payer: BLUE CROSS/BLUE SHIELD | Attending: Family Medicine | Admitting: Physician Assistant

## 2017-01-13 ENCOUNTER — Other Ambulatory Visit: Payer: Self-pay

## 2017-01-13 VITALS — BP 122/79 | HR 62 | Temp 97.9°F | Resp 12 | Ht 63.0 in | Wt 132.8 lb

## 2017-01-13 DIAGNOSIS — E038 Other specified hypothyroidism: Secondary | ICD-10-CM | POA: Diagnosis present

## 2017-01-13 DIAGNOSIS — Z79899 Other long term (current) drug therapy: Secondary | ICD-10-CM | POA: Diagnosis not present

## 2017-01-13 DIAGNOSIS — R05 Cough: Secondary | ICD-10-CM | POA: Diagnosis present

## 2017-01-13 DIAGNOSIS — J40 Bronchitis, not specified as acute or chronic: Secondary | ICD-10-CM | POA: Diagnosis not present

## 2017-01-13 MED ORDER — BENZONATATE 200 MG PO CAPS: 200.0000 mg | ORAL_CAPSULE | Freq: Two times a day (BID) | ORAL | 0 refills | Status: DC | PRN

## 2017-01-13 MED ORDER — AZITHROMYCIN 250 MG PO TABS: ORAL_TABLET | ORAL | 0 refills | Status: DC

## 2017-01-13 MED ORDER — LEVOTHYROXINE SODIUM 88 MCG PO TABS: ORAL_TABLET | ORAL | 6 refills | Status: DC

## 2017-01-13 NOTE — Progress Notes (Signed)
Patient ID: Audrey Schwartz, female   DOB: 25-Aug-1960, 56 y.o.   MRN: [id number]     Audrey Schwartz, is a 56 y.o. female  [id number]  [id number]  DOB - 25-Aug-1960  Subjective:  Chief Complaint and HPI: Audrey Schwartz is a 56 y.o. female here today to check her thyroid levels.  Denies fatigue, hair loss, night sweats.    Cough X 2-3 weeks days.  No f/c.  Some greenish yellow mucus.    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    ROS:   Constitutional:  No f/c, No night sweats, No unexplained weight loss. EENT:  No vision changes, No blurry vision, No hearing changes. No mouth, throat, or ear problems.  Respiratory: + cough, No SOB Cardiac: No CP, no palpitations GI:  No abd pain, No N/V/D. GU: No Urinary s/sx Musculoskeletal: No joint pain Neuro: No headache, no dizziness, no motor weakness.  Skin: No rash Endocrine:  No polydipsia. No polyuria.  Psych: Denies SI/HI  No problems updated.  ALLERGIES: Allergies  Allergen Reactions  . Amoxicillin Nausea And Vomiting    Has patient had a PCN reaction causing immediate rash, facial/tongue/throat swelling, SOB or lightheadedness with hypotension: yes Has patient had a PCN reaction causing severe rash involving mucus membranes or skin necrosis: no Has patient had a PCN reaction that required hospitalization : no Has patient had a PCN reaction occurring within the last 10 years: yes If all of the above answers are "NO", then may proceed with Cephalosporin use.     PAST MEDICAL HISTORY: Past Medical History:  Diagnosis Date  . Bruises easily for a long time   saw doctor, no problem found  . Complication of anesthesia 1997   slow to awaken after c section due to dentures accidentally left in  . Headache(784.0)    and nausea occasionally  . Hypothyroidism   . No pertinent past medical history    PT IS A POOR HISTORIAN   . Thyroid disease     MEDICATIONS AT HOME: Prior to Admission  medications   Medication Sig Start Date End Date Taking? Authorizing Provider  levothyroxine (SYNTHROID, LEVOTHROID) 88 MCG tablet TAKE 1 TABLET BY MOUTH EVERY DAY BEFORE BREAKFAST 01/13/17  Yes Anders SimmondsMcClung, Angela M, PA-C  azithromycin (ZITHROMAX) 250 MG tablet Take 2 tabs today then 1 daily 01/13/17   Georgian CoMcClung, Angela M, PA-C  benzonatate (TESSALON) 200 MG capsule Take 1 capsule (200 mg total) by mouth 2 (two) times daily as needed for cough. 01/13/17   Anders SimmondsMcClung, Angela M, PA-C  cetirizine (ZYRTEC) 10 MG tablet Take 1 tablet (10 mg total) by mouth daily. Patient not taking: Reported on 01/13/2017 06/24/16   Jaclyn ShaggyAmao, Enobong, MD  omeprazole (PRILOSEC) 20 MG capsule Take 1 capsule (20 mg total) by mouth daily. Patient not taking: Reported on 01/13/2017 06/24/16   Jaclyn ShaggyAmao, Enobong, MD     Objective:  EXAM:   Vitals:   01/13/17 1430  BP: 122/79  Pulse: 62  Resp: 12  Temp: 97.9 F (36.6 C)  SpO2: 100%  Weight: 132 lb 12.8 oz (60.2 kg)  Height: 5\' 3"  (1.6 m)    General appearance : A&OX3. NAD. Non-toxic-appearing HEENT: Atraumatic and Normocephalic.  PERRLA. EOM intact.  TM clear B. Mouth-MMM, post pharynx WNL w/o erythema, + PND. Neck: supple, no JVD. No cervical lymphadenopathy. No thyromegaly Chest/Lungs:  Breathing-non-labored, Good air entry bilaterally, breath sounds normal without rales, rhonchi, or wheezing  CVS: S1 S2 regular, no murmurs, gallops, rubs  Extremities: Bilateral Lower  Ext shows no edema, both legs are warm to touch with = pulse throughout Neurology:  CN II-XII grossly intact, Non focal.   Psych:  TP linear. J/I WNL. Normal speech. Appropriate eye contact and affect.  Skin:  No Rash  Data Review Lab Results  Component Value Date   HGBA1C 6.3 (H) 02/26/2009  glucose = 94 ON 06/24/2016   Assessment & Plan   1. Bronchitis Will cover for atypicals due to cough X 2-3 weeks - azithromycin (ZITHROMAX) 250 MG tablet; Take 2 tabs today then 1 daily  Dispense: 6 tablet;  Refill: 0 - benzonatate (TESSALON) 200 MG capsule; Take 1 capsule (200 mg total) by mouth 2 (two) times daily as needed for cough.  Dispense: 20 capsule; Refill: 0 Fluids, rest, respiratory care.  2. Other specified hypothyroidism - TSH - levothyroxine (SYNTHROID, LEVOTHROID) 88 MCG tablet; TAKE 1 TABLET BY MOUTH EVERY DAY BEFORE BREAKFAST  Dispense: 30 tablet; Refill: 6  Patient have been counseled extensively about nutrition and exercise  Return in about 6 months (around 07/14/2017) for Dr Sunday CornNewlin-6 month thyroid check.  The patient was given clear instructions to go to ER or return to medical center if symptoms don't improve, worsen or new problems develop. The patient verbalized understanding. The patient was told to call to get lab results if they haven't heard anything in the next week.     Georgian CoAngela McClung, PA-C [facility] [place], [place] [phone]   01/13/2017, 2:53 PM

## 2017-01-13 NOTE — Progress Notes (Signed)
Traveling out of town needs medication RF Cough for several days  Left arms numbness and burning from pain in chest 8 days ago.

## 2017-01-14 ENCOUNTER — Telehealth: Payer: Self-pay | Admitting: Family Medicine

## 2017-01-14 LAB — TSH: TSH: 1.74 u[IU]/mL (ref 0.450–4.500)

## 2017-01-14 NOTE — Telephone Encounter (Signed)
Pt. Called stating that [place] is going [place] the [place] and needs meds for 3 months. Pt. Went to her pharmacy and was only given a 30 day supply. Please f/u

## 2017-01-17 ENCOUNTER — Emergency Department (HOSPITAL_COMMUNITY)
Admission: EM | Admit: 2017-01-17 | Discharge: 2017-01-17 | Disposition: A | Discharge status: Home / Self Care | Payer: BLUE CROSS/BLUE SHIELD | Attending: Emergency Medicine | Admitting: Emergency Medicine

## 2017-01-17 ENCOUNTER — Other Ambulatory Visit: Payer: Self-pay

## 2017-01-17 ENCOUNTER — Other Ambulatory Visit: Payer: Self-pay | Admitting: Physician Assistant

## 2017-01-17 ENCOUNTER — Encounter (HOSPITAL_COMMUNITY): Payer: Self-pay | Admitting: Emergency Medicine

## 2017-01-17 ENCOUNTER — Emergency Department (HOSPITAL_COMMUNITY): Payer: BLUE CROSS/BLUE SHIELD

## 2017-01-17 DIAGNOSIS — Y93E9 Activity, other interior property and clothing maintenance: Secondary | ICD-10-CM | POA: Insufficient documentation

## 2017-01-17 DIAGNOSIS — E038 Other specified hypothyroidism: Secondary | ICD-10-CM

## 2017-01-17 DIAGNOSIS — S62652A Nondisplaced fracture of medial phalanx of right middle finger, initial encounter for closed fracture: Secondary | ICD-10-CM | POA: Insufficient documentation

## 2017-01-17 DIAGNOSIS — Y999 Unspecified external cause status: Secondary | ICD-10-CM | POA: Insufficient documentation

## 2017-01-17 DIAGNOSIS — W11XXXA Fall on and from ladder, initial encounter: Secondary | ICD-10-CM | POA: Diagnosis not present

## 2017-01-17 DIAGNOSIS — S62657A Nondisplaced fracture of medial phalanx of left little finger, initial encounter for closed fracture: Secondary | ICD-10-CM | POA: Insufficient documentation

## 2017-01-17 DIAGNOSIS — Y929 Unspecified place or not applicable: Secondary | ICD-10-CM | POA: Insufficient documentation

## 2017-01-17 DIAGNOSIS — S4992XA Unspecified injury of left shoulder and upper arm, initial encounter: Secondary | ICD-10-CM | POA: Diagnosis present

## 2017-01-17 DIAGNOSIS — S42215A Unspecified nondisplaced fracture of surgical neck of left humerus, initial encounter for closed fracture: Secondary | ICD-10-CM | POA: Diagnosis not present

## 2017-01-17 DIAGNOSIS — E039 Hypothyroidism, unspecified: Secondary | ICD-10-CM | POA: Diagnosis not present

## 2017-01-17 DIAGNOSIS — Z23 Encounter for immunization: Secondary | ICD-10-CM | POA: Diagnosis not present

## 2017-01-17 MED ORDER — IBUPROFEN 800 MG PO TABS: 800.0000 mg | ORAL_TABLET | Freq: Three times a day (TID) | ORAL | 0 refills | Status: DC

## 2017-01-17 MED ORDER — LEVOTHYROXINE SODIUM 88 MCG PO TABS: ORAL_TABLET | ORAL | 2 refills | Status: DC

## 2017-01-17 MED ORDER — OXYCODONE-ACETAMINOPHEN 5-325 MG PO TABS
1.0000 | ORAL_TABLET | Freq: Once | ORAL | Status: AC
  Administered 2017-01-17: 1 via ORAL
  Filled 2017-01-17: qty 1

## 2017-01-17 MED ORDER — TETANUS-DIPHTH-ACELL PERTUSSIS 5-2.5-18.5 LF-MCG/0.5 IM SUSP
0.5000 mL | Freq: Once | INTRAMUSCULAR | Status: AC
  Administered 2017-01-17: 0.5 mL via INTRAMUSCULAR
  Filled 2017-01-17: qty 0.5

## 2017-01-17 MED ORDER — OXYCODONE-ACETAMINOPHEN 5-325 MG PO TABS: 1.0000 | ORAL_TABLET | Freq: Four times a day (QID) | ORAL | 0 refills | Status: DC | PRN

## 2017-01-17 NOTE — ED Notes (Signed)
Return from xray

## 2017-01-17 NOTE — ED Provider Notes (Signed)
Patient signed out to me by A Law, PA-C.  Please see previous notes for further history.  In brief, patient fell off a ladder today.  She fell landing on her left shoulder.  She has pain of her shoulder, DIP of the left pinky, and DIP of the right middle finger.  X-rays show finger avulsion fractures and comminuted relatively nondisplaced humerus fracture.  Finger splints applied.  Pending ortho call back.  Case discussed with Dr. Linna CapriceSwinteck from orthopedics.  Patient to be placed in sling immobilizer and nonweightbearing with follow-up with Dr. Aundria Rudogers in 1 week.  Discussed findings and plan with patient.  Discussed NSAID use with breakthrough opioid medicine.  Discussed sling use.  Discussed follow-up with orthopedics and hand.  At this time, patient appears safe for discharge.  Return precautions given.  Patient states she understands and agrees to plan.    Alveria ApleyCaccavale, Traquan Duarte, PA-C 01/18/17 0124    Rolan BuccoBelfi, Melanie, MD 01/18/17 1401

## 2017-01-17 NOTE — ED Notes (Signed)
REpaged [place] to [employer] Law

## 2017-01-17 NOTE — Telephone Encounter (Signed)
Please let patient know-I sent a new prescription for 90 day supply to [facility]. Thanks, Georgian CoAngela Drucella Karbowski, PA-C

## 2017-01-17 NOTE — ED Triage Notes (Signed)
Pt was hanging Christmas lights and fell onto her left shoulder. Pt complaining of pain from left shoulder all the way down her arm. Pt states she cannot lift arm due to the pain.

## 2017-01-17 NOTE — Discharge Instructions (Addendum)
Toma ibupofren 3 veces cada dia con comia. No toma otras medicinas como advil, naproxen, motrin, o aleve.  Toma percocet por dolor fuerte. [place] hielo 4 veces cada dia por 20 minutos.  Da Patsi Searsuna cito con el ortopedico, Dr. Aundria Rudogers, en una semana para el hombro.  Da Neomia Dearuna cita con el doctor de los manos en, Dr. Izora Ribasoley, en una semana para los dedos.  Lleva la eslinga todo el tiempo hasta que vaya al ortopedica.  Regresa a la sala de emergencia si tiene mas dolor, tine entumecimiento, o otros problemas.   Take ibuprofen 3 times a day with meals.  Do not take other anti-inflammatories at the same time open (Advil, Motrin, naproxen, Aleve).  Take Percocet as needed for severe pain. Use ice 4 times a day for 20 minutes at a time. Make an appointment with the orthopedic doctor, Dr. Aundria Rudogers, in 1 week for further evaluation and management of your shoulder. Make an appointment with the hand doctor, Dr. Izora Ribasoley, in 1 week for further evaluation and management of your finger pain. Wear the shoulder sling at all times until you are evaluated by the orthopedic doctor.

## 2017-01-17 NOTE — ED Notes (Signed)
Patient transported to X-ray 

## 2017-01-17 NOTE — ED Provider Notes (Signed)
[facility] Provider Note   CSN: [id number] Arrival date & time: 01/17/17  1248     History   Chief Complaint Chief Complaint  Patient presents with  . Shoulder Injury    HPI Audrey Schwartz is a 56 y.o. female with history of hypothyroidism who presents with left shoulder pain after a fall.  Patient reports she was up on a ladder putting up Christmas lights when the ladder gave way and she fell directly on her left shoulder.  She also somehow hit her left fifth finger and right middle finger.  She has some pain to these areas.  She has small amount of bleeding to the tip of her left fifth finger, which is controlled.  Her tetanus is not up-to-date.  She denies any numbness or tingling left arm.  She has too much pain to be able to move it.  She denies any other injuries.  She did not hit her head.  HPI  Past Medical History:  Diagnosis Date  . Bruises easily for a long time   saw doctor, no problem found  . Complication of anesthesia 1997   slow to awaken after c section due to dentures accidentally left in  . Headache(784.0)    and nausea occasionally  . Hypothyroidism   . No pertinent past medical history    PT IS A POOR HISTORIAN   . Thyroid disease     Patient Active Problem List   Diagnosis Date Noted  . Unspecified hypothyroidism 12/01/2012  . Annual physical exam 12/01/2012  . Contact dermatitis 12/01/2012  . Hypothyroidism 05/26/2012  . Hematuria 05/26/2012  . Suprapubic tenderness 05/26/2012    Past Surgical History:  Procedure Laterality Date  . CESAREAN SECTION  1997  . COLONOSCOPY WITH PROPOFOL N/A 08/07/2013   Procedure: COLONOSCOPY WITH PROPOFOL;  Surgeon: Charolett BumpersMartin K Johnson, MD;  Location: [facility];  Service: Endoscopy;  Laterality: N/A;    OB History    Gravida Para Term Preterm AB Living   8 7 6 1 1 6    SAB TAB Ectopic Multiple Live Births   1               Home Medications    Prior to  Admission medications   Medication Sig Start Date End Date Taking? Authorizing Provider  azithromycin (ZITHROMAX) 250 MG tablet Take 2 tabs today then 1 daily 01/13/17  Yes McClung, Angela M, PA-C  benzonatate (TESSALON) 200 MG capsule Take 1 capsule (200 mg total) by mouth 2 (two) times daily as needed for cough. 01/13/17  Yes Georgian CoMcClung, Angela M, PA-C  ibuprofen (ADVIL,MOTRIN) 200 MG tablet Take 200 mg by mouth every 6 (six) hours as needed for moderate pain.   Yes [provider]  levothyroxine (SYNTHROID, LEVOTHROID) 88 MCG tablet TAKE 1 TABLET BY MOUTH EVERY DAY BEFORE BREAKFAST 01/17/17  Yes McClung, Angela M, PA-C  cetirizine (ZYRTEC) 10 MG tablet Take 1 tablet (10 mg total) by mouth daily. Patient not taking: Reported on 01/13/2017 06/24/16   Jaclyn ShaggyAmao, Enobong, MD  ibuprofen (ADVIL,MOTRIN) 800 MG tablet Take 1 tablet (800 mg total) by mouth 3 (three) times daily with meals. 01/17/17   Caccavale, Sophia, PA-C  omeprazole (PRILOSEC) 20 MG capsule Take 1 capsule (20 mg total) by mouth daily. Patient not taking: Reported on 01/13/2017 06/24/16   Jaclyn ShaggyAmao, Enobong, MD  oxyCODONE-acetaminophen (PERCOCET/ROXICET) 5-325 MG tablet Take 1 tablet by mouth every 6 (six) hours as needed for severe pain. 01/17/17  Caccavale, Sophia, PA-C    Family History History reviewed. No pertinent family history.  Social History Social History   Tobacco Use  . Smoking status: Never Smoker  . Smokeless tobacco: Never Used  Substance Use Topics  . Alcohol use: No  . Drug use: No     Allergies   Amoxicillin and Penicillins   Review of Systems Review of Systems  Musculoskeletal: Positive for arthralgias (L shoulder). Negative for back pain and neck pain.  Skin: Positive for wound.  Neurological: Negative for numbness.     Physical Exam Updated Vital Signs BP 101/66 (BP Location: Right Arm)   Pulse 64   Temp 97.7 F (36.5 C) (Oral)   Resp 16   LMP 02/01/2009 (Approximate)   SpO2 99%    Physical Exam  Constitutional: She appears well-developed and well-nourished. No distress.  HENT:  Head: Normocephalic and atraumatic.  Mouth/Throat: Oropharynx is clear and moist. No oropharyngeal exudate.  Eyes: Conjunctivae are normal. Pupils are equal, round, and reactive to light. Right eye exhibits no discharge. Left eye exhibits no discharge. No scleral icterus.  Neck: Normal range of motion. Neck supple. No thyromegaly present.  Cardiovascular: Normal rate, regular rhythm, normal heart sounds and intact distal pulses. Exam reveals no gallop and no friction rub.  No murmur heard. Pulmonary/Chest: Effort normal and breath sounds normal. No stridor. No respiratory distress. She has no wheezes. She has no rales.  Abdominal: Soft. Bowel sounds are normal. She exhibits no distension. There is no tenderness. There is no rebound and no guarding.  Musculoskeletal: She exhibits no edema.       Left shoulder: She exhibits decreased range of motion, tenderness, bony tenderness, swelling and decreased strength. She exhibits normal pulse.  L shoulder: Tenderness noted to the left shoulder, humerus, and elbow; mild edema noted to the left shoulder; range of motion is limited, patient refuses due to pain  Lymphadenopathy:    She has no cervical adenopathy.  Neurological: She is alert. Coordination normal.  Decreased grip strength in the left hand compared to the right; sensation intact; range of motion of the left upper extremity limited due to pain  Skin: Skin is warm and dry. No rash noted. She is not diaphoretic. No pallor.  Psychiatric: She has a normal mood and affect.  Nursing note and vitals reviewed.    ED Treatments / Results  Labs (all labs ordered are listed, but only abnormal results are displayed) Labs Reviewed - No data to display  EKG  EKG Interpretation None       Radiology Dg Elbow Complete Left  Result Date: 01/17/2017 CLINICAL DATA:  Acute left elbow pain  following fall today. Initial encounter. EXAM: LEFT ELBOW - COMPLETE 3+ VIEW COMPARISON:  None. FINDINGS: No acute fracture, subluxation or dislocation. No joint effusion. No suspicious focal bony abnormalities noted. IMPRESSION: No acute abnormality. Electronically Signed   By: Harmon PierJeffrey  Hu M.D.   On: 01/17/2017 17:08   Dg Shoulder Left  Result Date: 01/17/2017 CLINICAL DATA:  Larey SeatFell today and injured left shoulder. EXAM: LEFT SHOULDER - 2+ VIEW COMPARISON:  None. FINDINGS: Comminuted but relatively nondisplaced humeral neck fractures. No dislocation. [facility] is [facility]. The visualized left ribs are intact in the visualized left lung is clear. IMPRESSION: Comminuted but relatively nondisplaced humeral neck fracture. Electronically Signed   By: Rudie MeyerP.  Gallerani M.D.   On: 01/17/2017 13:47   Dg Finger Little Left  Result Date: 01/17/2017 CLINICAL DATA:  Fall. EXAM: LEFT LITTLE FINGER  2+V COMPARISON:  None. FINDINGS: There is a nondisplaced avulsion fracture at the dorsal base of the little finger distal phalanx with slight flexion at the DIP joint. Joint spaces are preserved. Bone mineralization is normal. Soft tissues are unremarkable. IMPRESSION: Nondisplaced avulsion fracture at the dorsal base of the little finger distal phalanx. Electronically Signed   By: Obie Dredge M.D.   On: 01/17/2017 17:10   Dg Finger Middle Right  Result Date: 01/17/2017 CLINICAL DATA:  Fall. EXAM: RIGHT MIDDLE FINGER 2+V COMPARISON:  None. FINDINGS: There is a nondisplaced avulsion fracture at the volar base of the middle finger middle phalanx. Joint spaces are preserved. Bone mineralization is normal. Soft tissues are unremarkable. IMPRESSION: Nondisplaced avulsion fracture at the volar base of the middle finger middle phalanx. Electronically Signed   By: Obie Dredge M.D.   On: 01/17/2017 17:11    Procedures Procedures (including critical care time)  Medications Ordered in ED Medications  Tdap (BOOSTRIX)  injection 0.5 mL (0.5 mLs Intramuscular Given 01/17/17 1617)  oxyCODONE-acetaminophen (PERCOCET/ROXICET) 5-325 MG per tablet 1 tablet (1 tablet Oral Given 01/17/17 1616)     Initial Impression / Assessment and Plan / ED Course  I have reviewed the triage vital signs and the nursing notes.  Pertinent labs & imaging results that were available during my care of the patient were reviewed by me and considered in my medical decision making (see chart for details).     Patient with comminuted but relatively nondisplaced humeral.  Patient also with nondisplaced avulsion fracture at the dorsal base of the little finger distal phalanx as well as volar base of the middle finger middle phalanx.  Patient placed in sling after discussion with orthopedics by my colleague, Sophia Caccavale after shift change.  Patient placed in static finger splints for above.  Tdap updated in the ED for minor, superficial bleeding to left fifth digit.  No repair indicated at this time.  Discharge patient home with pain medication.  Supportive treatment discussed.  Patient also to follow-up with hand for further evaluation and treatment of finger fractures.  Final Clinical Impressions(s) / ED Diagnoses   Final diagnoses:  Unspecified nondisplaced fracture of surgical neck of left humerus, initial encounter for closed fracture  Closed nondisplaced fracture of middle phalanx of left little finger, initial encounter  Closed nondisplaced fracture of middle phalanx of right middle finger, initial encounter    ED Discharge Orders        Ordered    oxyCODONE-acetaminophen (PERCOCET/ROXICET) 5-325 MG tablet  Every 6 hours PRN     01/17/17 1812    ibuprofen (ADVIL,MOTRIN) 800 MG tablet  3 times daily with meals     01/17/17 1812       Emi Holes, PA-C 01/18/17 1628    Rolan Bucco, MD 01/28/17 1517

## 2017-01-20 ENCOUNTER — Telehealth: Payer: Self-pay | Admitting: *Deleted

## 2017-01-20 NOTE — Telephone Encounter (Signed)
Please call patient. Thyroid medication is working well at the current dose. Continue current dose and follow-up in 6 months. Thanks, Georgian CoAngela McClung, PA-C   Left message to return call. Assistance provided by Fawn KirkAreli [phone]

## 2017-01-21 NOTE — Telephone Encounter (Signed)
Called pt. And informed her of Rx for [facility] being [facility] for [facility] day supply.

## 2017-04-19 ENCOUNTER — Other Ambulatory Visit: Payer: Self-pay | Admitting: Family Medicine

## 2017-04-19 DIAGNOSIS — Z1231 Encounter for screening mammogram for malignant neoplasm of breast: Secondary | ICD-10-CM

## 2017-04-26 ENCOUNTER — Telehealth: Payer: Self-pay | Admitting: Family Medicine

## 2017-04-26 DIAGNOSIS — E038 Other specified hypothyroidism: Secondary | ICD-10-CM

## 2017-04-26 MED ORDER — LEVOTHYROXINE SODIUM 88 MCG PO TABS: ORAL_TABLET | ORAL | 0 refills | Status: DC

## 2017-04-26 NOTE — Telephone Encounter (Signed)
Refilled to last until next appt

## 2017-04-26 NOTE — Telephone Encounter (Signed)
Pt. Came to facility requesting a refill on levothyroxine (SYNTHROID, LEVOTHROID) 88 MCG tablet  Pt. Uses [facility] pharmacy. Pt. Has scheduled an appt. Please f/u

## 2017-05-09 ENCOUNTER — Ambulatory Visit
Admission: RE | Admit: 2017-05-09 | Discharge: 2017-05-09 | Disposition: A | Discharge status: Home / Self Care | Payer: BLUE CROSS/BLUE SHIELD | Source: Ambulatory Visit | Attending: Family Medicine | Admitting: Family Medicine

## 2017-05-09 DIAGNOSIS — Z1231 Encounter for screening mammogram for malignant neoplasm of breast: Secondary | ICD-10-CM

## 2017-05-23 ENCOUNTER — Telehealth: Payer: Self-pay

## 2017-05-23 NOTE — Telephone Encounter (Signed)
Patient was called and informed of lab results via interpretor.([id number]) 

## 2017-06-30 ENCOUNTER — Ambulatory Visit: Payer: BLUE CROSS/BLUE SHIELD | Attending: Family Medicine | Admitting: Family Medicine

## 2017-06-30 ENCOUNTER — Encounter: Payer: Self-pay | Admitting: Family Medicine

## 2017-06-30 VITALS — BP 111/75 | HR 64 | Temp 97.5°F | Ht 63.0 in | Wt 143.4 lb

## 2017-06-30 DIAGNOSIS — Z79899 Other long term (current) drug therapy: Secondary | ICD-10-CM | POA: Diagnosis not present

## 2017-06-30 DIAGNOSIS — Z880 Allergy status to penicillin: Secondary | ICD-10-CM | POA: Diagnosis not present

## 2017-06-30 DIAGNOSIS — Z79890 Hormone replacement therapy (postmenopausal): Secondary | ICD-10-CM | POA: Insufficient documentation

## 2017-06-30 DIAGNOSIS — E038 Other specified hypothyroidism: Secondary | ICD-10-CM | POA: Diagnosis not present

## 2017-06-30 DIAGNOSIS — E039 Hypothyroidism, unspecified: Secondary | ICD-10-CM | POA: Diagnosis present

## 2017-06-30 NOTE — Progress Notes (Signed)
Subjective:  Patient ID: Audrey Schwartz, female    DOB: 02-Jul-1960  Age: 57 y.o. MRN: [id number]  CC: Hypothyroidism   HPI Audrey Schwartz is a 57 year old female with a history of Hypothyroidism who presents today for a follow up visit. She was last seen in the clinic one year ago but states she has been compliant with her Levothyroxine. She sustained a left humerus fracture five months ago which has healed with her last appointment with Orthopedics last month. She has no acute concerns today.  Past Medical History:  Diagnosis Date  . Bruises easily for a long time   saw doctor, no problem found  . Complication of anesthesia 1997   slow to awaken after c section due to dentures accidentally left in  . Headache(784.0)    and nausea occasionally  . Hypothyroidism   . No pertinent past medical history    PT IS A POOR HISTORIAN   . Thyroid disease     Past Surgical History:  Procedure Laterality Date  . CESAREAN SECTION  1997  . COLONOSCOPY WITH PROPOFOL N/A 08/07/2013   Procedure: COLONOSCOPY WITH PROPOFOL;  Surgeon: Garlan Fair, MD;  Location: [facility];  Service: Endoscopy;  Laterality: N/A;    Allergies  Allergen Reactions  . Amoxicillin Nausea And Vomiting    Has patient had a PCN reaction causing immediate rash, facial/tongue/throat swelling, SOB or lightheadedness with hypotension: yes Has patient had a PCN reaction causing severe rash involving mucus membranes or skin necrosis: no Has patient had a PCN reaction that required hospitalization : no Has patient had a PCN reaction occurring within the last 10 years: yes If all of the above answers are "NO", then may proceed with Cephalosporin use.   Marland Kitchen Penicillins Nausea And Vomiting    Has patient had a PCN reaction causing immediate rash, facial/tongue/throat swelling, SOB or lightheadedness with hypotension: no Has patient had a PCN reaction causing severe rash involving mucus membranes or skin  necrosis: no Has patient had a PCN reaction that required hospitalization: no Has patient had a PCN reaction occurring within the last 10 years: no If all of the above answers are "NO", then may proceed with Cephalosporin use.      Outpatient Medications Prior to Visit  Medication Sig Dispense Refill  . levothyroxine (SYNTHROID, LEVOTHROID) 88 MCG tablet TAKE 1 TABLET BY MOUTH EVERY DAY BEFORE BREAKFAST 90 tablet 0  . azithromycin (ZITHROMAX) 250 MG tablet Take 2 tabs today then 1 daily (Patient not taking: Reported on 06/30/2017) 6 tablet 0  . benzonatate (TESSALON) 200 MG capsule Take 1 capsule (200 mg total) by mouth 2 (two) times daily as needed for cough. (Patient not taking: Reported on 06/30/2017) 20 capsule 0  . cetirizine (ZYRTEC) 10 MG tablet Take 1 tablet (10 mg total) by mouth daily. (Patient not taking: Reported on 01/13/2017) 30 tablet 1  . ibuprofen (ADVIL,MOTRIN) 200 MG tablet Take 200 mg by mouth every 6 (six) hours as needed for moderate pain.    Marland Kitchen ibuprofen (ADVIL,MOTRIN) 800 MG tablet Take 1 tablet (800 mg total) by mouth 3 (three) times daily with meals. (Patient not taking: Reported on 06/30/2017) 30 tablet 0  . omeprazole (PRILOSEC) 20 MG capsule Take 1 capsule (20 mg total) by mouth daily. (Patient not taking: Reported on 01/13/2017) 30 capsule 6  . oxyCODONE-acetaminophen (PERCOCET/ROXICET) 5-325 MG tablet Take 1 tablet by mouth every 6 (six) hours as needed for severe pain. (Patient not taking: Reported on 06/30/2017)  10 tablet 0   No facility-administered medications prior to visit.     ROS Review of Systems  Constitutional: Negative for activity change, appetite change and fatigue.  HENT: Negative for congestion, sinus pressure and sore throat.   Eyes: Negative for visual disturbance.  Respiratory: Negative for cough, chest tightness, shortness of breath and wheezing.   Cardiovascular: Negative for chest pain and palpitations.  Gastrointestinal: Negative for  abdominal distention, abdominal pain and constipation.  Endocrine: Negative for polydipsia.  Genitourinary: Negative for dysuria and frequency.  Musculoskeletal: Negative for arthralgias and back pain.  Skin: Negative for rash.  Neurological: Negative for tremors, light-headedness and numbness.  Hematological: Does not bruise/bleed easily.  Psychiatric/Behavioral: Negative for agitation and behavioral problems.    Objective:  BP 111/75   Pulse 64   Temp (!) 97.5 F (36.4 C) (Oral)   Ht '5\' 3"'$  (1.6 m)   Wt 143 lb 6.4 oz (65 kg)   LMP 02/01/2009 (Approximate)   SpO2 98%   BMI 25.40 kg/m   BP/Weight 06/30/2017 01/17/2017 68/25/7493  Systolic BP 552 174 715  Diastolic BP 75 66 79  Wt. (Lbs) 143.4 - 132.8  BMI 25.4 - 23.52      Physical Exam  Constitutional: She is oriented to person, place, and time. She appears well-developed and well-nourished.  Neck: No thyromegaly present.  Cardiovascular: Normal rate, normal heart sounds and intact distal pulses.  No murmur heard. Pulmonary/Chest: Effort normal and breath sounds normal. She has no wheezes. She has no rales. She exhibits no tenderness.  Abdominal: Soft. Bowel sounds are normal. She exhibits no distension and no mass. There is no tenderness.  Musculoskeletal: Normal range of motion.  Neurological: She is alert and oriented to person, place, and time.     Lab Results  Component Value Date   TSH 1.740 01/13/2017    Assessment & Plan:   1. Other specified hypothyroidism Controlled Will send off TSH and adjust dose of Levothyroxine accordingly - CMP14+EGFR - Lipid panel - T4, free - TSH   No orders of the defined types were placed in this encounter.   Follow-up: Return in about 1 month (around 07/31/2017) for PAP smear.   Charlott Rakes MD

## 2017-06-30 NOTE — Patient Instructions (Signed)
Hipotiroidismo  Hypothyroidism  El hipotiroidismo es un trastorno de la tiroides. una glndula grande ubicada en la parte anterior e inferior del cuello. La tiroides libera hormonas que controlan el funcionamiento del organismo. En los casos de hipotiroidismo, la glndula no produce la cantidad suficiente de estas hormonas.  Cules son las causas?  Las causas del hipotiroidismo pueden incluir lo siguiente:   Infecciones virales.   Embarazo.   Un ataque del sistema de defensa (sistema inmunitario) a la tiroides.   Ciertos medicamentos.   Defectos congnitos.   Radioterapias anteriores en la cabeza o el cuello.   Tratamiento previo con yodo radioactivo.   Extirpacin quirrgica previa de una parte o de toda la tiroides.   Problemas con la glndula ubicada en el centro del cerebro (hipfisis).    Cules son los signos o los sntomas?  Los signos y los sntomas de hipotiroidismo pueden ser los siguientes:   Sensacin de falta de energa (letargo).   Incapacidad para tolerar el fro.   Aumento de peso que no puede explicarse por un cambio en la dieta o en los hbitos de ejercicio fsico.   Piel seca.   Pelo grueso.   Irregularidades menstruales.   Ralentizacin de los procesos de pensamiento.   Estreimiento.   Tristeza o depresin.    Cmo se diagnostica?  El mdico puede diagnosticar el hipotiroidismo con anlisis de sangre y ecografas.  Cmo se trata?  El hipotiroidismo se trata con medicamentos que reemplazan las hormonas que el cuerpo no produce. Despus de comenzar el tratamiento, pueden pasar varias semanas hasta la desaparicin de los sntomas.  Siga estas instrucciones en su casa:   Tome los medicamentos solamente como se lo haya indicado el mdico.   Si empieza a tomar medicamentos nuevos, infrmele al mdico.   Concurra a todas las visitas de control como se lo haya indicado el mdico. Esto es importante. A medida que la enfermedad mejora, es posible que haya que modificar las dosis.  Tendr que hacerse anlisis de sangre peridicamente, de modo que el mdico pueda controlar la enfermedad.  Comunquese con un mdico si:   Los sntomas no mejoran con el tratamiento.   Est tomando medicamentos sustitutivos de la tiroides y:  ? Suda en exceso.  ? Siente temblores.  ? Est ansioso.  ? Baja de peso rpidamente.  ? No puede tolerar el calor.  ? Tiene cambios emocionales.  ? Tiene diarrea.  ? Se siente dbil.  Solicite ayuda de inmediato si:   Siente dolor en el pecho.   Tiene latidos cardacos irregulares o siente dolor en el pecho.   Nota que la frecuencia cardaca est acelerada.  Esta informacin no tiene como fin reemplazar el consejo del mdico. Asegrese de hacerle al mdico cualquier pregunta que tenga.  Document Released: 01/18/2005 Document Revised: 04/26/2016 Document Reviewed: 06/05/2013  Elsevier Interactive Patient Education  2018 [employer]

## 2017-07-01 ENCOUNTER — Other Ambulatory Visit: Payer: Self-pay | Admitting: Family Medicine

## 2017-07-01 DIAGNOSIS — E038 Other specified hypothyroidism: Secondary | ICD-10-CM

## 2017-07-01 LAB — CMP14+EGFR
A/G RATIO: 1.6 (ref 1.2–2.2)
ALK PHOS: 90 IU/L (ref 39–117)
ALT: 9 IU/L (ref 0–32)
AST: 21 IU/L (ref 0–40)
Albumin: 4.5 g/dL (ref 3.5–5.5)
BILIRUBIN TOTAL: 0.6 mg/dL (ref 0.0–1.2)
BUN/Creatinine Ratio: 17 (ref 9–23)
BUN: 12 mg/dL (ref 6–24)
CALCIUM: 9.4 mg/dL (ref 8.7–10.2)
CO2: 23 mmol/L (ref 20–29)
Chloride: 104 mmol/L (ref 96–106)
Creatinine, Ser: 0.69 mg/dL (ref 0.57–1.00)
GFR calc Af Amer: 110 mL/min/{1.73_m2} (ref >59)
GFR calc non Af Amer: 96 mL/min/{1.73_m2} (ref >59)
Globulin, Total: 2.8 g/dL (ref 1.5–4.5)
Glucose: 92 mg/dL (ref 65–99)
POTASSIUM: 4.2 mmol/L (ref 3.5–5.2)
Sodium: 142 mmol/L (ref 134–144)
Total Protein: 7.3 g/dL (ref 6.0–8.5)

## 2017-07-01 LAB — LIPID PANEL
CHOLESTEROL TOTAL: 198 mg/dL (ref 100–199)
Chol/HDL Ratio: 3.9 ratio (ref 0.0–4.4)
HDL: 51 mg/dL (ref >39)
LDL Calculated: 122 mg/dL — ABNORMAL HIGH (ref 0–99)
TRIGLYCERIDES: 124 mg/dL (ref 0–149)
VLDL Cholesterol Cal: 25 mg/dL (ref 5–40)

## 2017-07-01 LAB — TSH: TSH: 2.28 u[IU]/mL (ref 0.450–4.500)

## 2017-07-01 LAB — T4, FREE: Free T4: 1.56 ng/dL (ref 0.82–1.77)

## 2017-07-01 MED ORDER — LEVOTHYROXINE SODIUM 88 MCG PO TABS: ORAL_TABLET | ORAL | 1 refills | Status: DC

## 2017-07-06 ENCOUNTER — Telehealth: Payer: Self-pay

## 2017-07-06 NOTE — Telephone Encounter (Signed)
Patient was called and informed of lab results via interpreter([id number]) 

## 2017-08-25 ENCOUNTER — Encounter: Payer: Self-pay | Admitting: Family Medicine

## 2017-08-25 ENCOUNTER — Ambulatory Visit: Payer: BLUE CROSS/BLUE SHIELD | Attending: Family Medicine | Admitting: Family Medicine

## 2017-08-25 VITALS — BP 109/69 | HR 59 | Temp 97.6°F | Ht 63.0 in | Wt 145.8 lb

## 2017-08-25 DIAGNOSIS — Z880 Allergy status to penicillin: Secondary | ICD-10-CM | POA: Diagnosis not present

## 2017-08-25 DIAGNOSIS — Z124 Encounter for screening for malignant neoplasm of cervix: Secondary | ICD-10-CM | POA: Diagnosis not present

## 2017-08-25 DIAGNOSIS — Z79899 Other long term (current) drug therapy: Secondary | ICD-10-CM | POA: Insufficient documentation

## 2017-08-25 DIAGNOSIS — Z1151 Encounter for screening for human papillomavirus (HPV): Secondary | ICD-10-CM | POA: Diagnosis not present

## 2017-08-25 DIAGNOSIS — E039 Hypothyroidism, unspecified: Secondary | ICD-10-CM | POA: Insufficient documentation

## 2017-08-25 DIAGNOSIS — Z79890 Hormone replacement therapy (postmenopausal): Secondary | ICD-10-CM | POA: Insufficient documentation

## 2017-08-25 NOTE — Progress Notes (Signed)
Subjective:  Patient ID: Audrey Schwartz, female    DOB: March 26, 1960  Age: 57 y.o. MRN: [id number]  CC: Gynecologic Exam   HPI Analyse Angst is a 57 year old female with a history of hypothyroidism who presents today for a Pap smear. Her last mammogram was in 05/2017 and was negative for malignancy. She has no additional concerns today  Past Medical History:  Diagnosis Date  . Bruises easily for a long time   saw doctor, no problem found  . Complication of anesthesia 1997   slow to awaken after c section due to dentures accidentally left in  . Headache(784.0)    and nausea occasionally  . Hypothyroidism   . No pertinent past medical history    PT IS A POOR HISTORIAN   . Thyroid disease     Past Surgical History:  Procedure Laterality Date  . CESAREAN SECTION  1997  . COLONOSCOPY WITH PROPOFOL N/A 08/07/2013   Procedure: COLONOSCOPY WITH PROPOFOL;  Surgeon: Charolett Bumpers, MD;  Location: [facility];  Service: Endoscopy;  Laterality: N/A;    Allergies  Allergen Reactions  . Amoxicillin Nausea And Vomiting    Has patient had a PCN reaction causing immediate rash, facial/tongue/throat swelling, SOB or lightheadedness with hypotension: yes Has patient had a PCN reaction causing severe rash involving mucus membranes or skin necrosis: no Has patient had a PCN reaction that required hospitalization : no Has patient had a PCN reaction occurring within the last 10 years: yes If all of the above answers are "NO", then may proceed with Cephalosporin use.   Marland Kitchen Penicillins Nausea And Vomiting    Has patient had a PCN reaction causing immediate rash, facial/tongue/throat swelling, SOB or lightheadedness with hypotension: no Has patient had a PCN reaction causing severe rash involving mucus membranes or skin necrosis: no Has patient had a PCN reaction that required hospitalization: no Has patient had a PCN reaction occurring within the last 10 years: no If all of the above  answers are "NO", then may proceed with Cephalosporin use.      Outpatient Medications Prior to Visit  Medication Sig Dispense Refill  . levothyroxine (SYNTHROID, LEVOTHROID) 88 MCG tablet TAKE 1 TABLET BY MOUTH EVERY DAY BEFORE BREAKFAST 90 tablet 1  . omeprazole (PRILOSEC) 20 MG capsule Take 1 capsule (20 mg total) by mouth daily. 30 capsule 6  . azithromycin (ZITHROMAX) 250 MG tablet Take 2 tabs today then 1 daily (Patient not taking: Reported on 06/30/2017) 6 tablet 0  . benzonatate (TESSALON) 200 MG capsule Take 1 capsule (200 mg total) by mouth 2 (two) times daily as needed for cough. (Patient not taking: Reported on 06/30/2017) 20 capsule 0  . cetirizine (ZYRTEC) 10 MG tablet Take 1 tablet (10 mg total) by mouth daily. (Patient not taking: Reported on 01/13/2017) 30 tablet 1  . ibuprofen (ADVIL,MOTRIN) 200 MG tablet Take 200 mg by mouth every 6 (six) hours as needed for moderate pain.    Marland Kitchen ibuprofen (ADVIL,MOTRIN) 800 MG tablet Take 1 tablet (800 mg total) by mouth 3 (three) times daily with meals. (Patient not taking: Reported on 06/30/2017) 30 tablet 0  . oxyCODONE-acetaminophen (PERCOCET/ROXICET) 5-325 MG tablet Take 1 tablet by mouth every 6 (six) hours as needed for severe pain. (Patient not taking: Reported on 06/30/2017) 10 tablet 0   No facility-administered medications prior to visit.     ROS Review of Systems  Constitutional: Negative for activity change and appetite change.  HENT: Negative for sinus pressure and sore  throat.   Respiratory: Negative for chest tightness, shortness of breath and wheezing.   Cardiovascular: Negative for chest pain and palpitations.  Gastrointestinal: Negative for abdominal distention, abdominal pain and constipation.  Genitourinary: Negative.   Musculoskeletal: Negative.   Psychiatric/Behavioral: Negative for behavioral problems and dysphoric mood.    Objective:  BP 109/69   Pulse (!) 59   Temp 97.6 F (36.4 C) (Oral)   Ht 5\' 3"  (1.6 m)    Wt 145 lb 12.8 oz (66.1 kg)   LMP 02/01/2009 (Approximate)   SpO2 100%   BMI 25.83 kg/m   BP/Weight 08/25/2017 06/30/2017 01/17/2017  Systolic BP 109 111 101  Diastolic BP 69 75 66  Wt. (Lbs) 145.8 143.4 -  BMI 25.83 25.4 -      Physical Exam  Constitutional: She is oriented to person, place, and time. She appears well-developed and well-nourished.  Cardiovascular: Normal rate, normal heart sounds and intact distal pulses.  No murmur heard. Pulmonary/Chest: Effort normal and breath sounds normal. She has no wheezes. She has no rales. She exhibits no tenderness.  Abdominal: Soft. Bowel sounds are normal. She exhibits no distension and no mass. There is no tenderness.  Genitourinary:  Genitourinary Comments: Normal external genitalia, normal vagina, normal cervix  Musculoskeletal: Normal range of motion.  Neurological: She is alert and oriented to person, place, and time.  Skin: Skin is warm and dry.     Assessment & Plan:   1. Screening for cervical cancer - Cytology - PAP([place])   No orders of the defined types were placed in this encounter.   Follow-up: Return in about 3 months (around 11/25/2017) for follow up on  hypothyroidism.   Hoy RegisterEnobong Sefora Tietje MD

## 2017-08-25 NOTE — Patient Instructions (Signed)
[place] (Health Maintenance, Female) Un estilo de vida saludable y los cuidados preventivos pueden favorecer considerablemente a la salud y Musician. Pregunte a su mdico cul es el cronograma de exmenes peridicos apropiado para usted. Esta es una buena oportunidad para consultarlo sobre cmo prevenir enfermedades y [place] sano. Adems de los controles, hay muchas otras cosas que puede hacer usted mismo. Los expertos han realizado numerosas investigaciones [employer] cambios en el estilo de vida y las medidas de prevencin que, [place], lo ayudarn a mantenerse sano. Solicite a su mdico ms informacin. EL PESO Y LA DIETA Consuma una dieta saludable.  Asegrese de [employer] verduras, frutas, productos lcteos de bajo contenido de [place] y Advertising account planner.  No consuma muchos alimentos de alto contenido de grasas slidas, azcares agregados o sal.  Realice actividad fsica con regularidad. Esta es una de las prcticas ms importantes que puede hacer por su salud. ? La Delorise Shiner de los adultos deben hacer ejercicio durante al menos 124mnutos por semana. El ejercicio debe aumentar la frecuencia cardaca y pActorla transpiracin (ejercicio de i[place]. ? La mayora de los adultos tambin deben hacer ejercicios de elongacin al mToysRusveces a la semana. Agregue esto al su plan de ejercicio de intensidad moderada. Mantenga un peso saludable.  El ndice de masa corporal ([facility] es una medida que puede utilizarse para identificar posibles problemas de pEast [place]town Proporciona una estimacin de la grasa corporal basndose en el peso y la altura. Su mdico puede ayudarle a dRadiation protection practitionerISouth Endy a lScientist, forensico mTheatre managerun peso saludable.  Para las mujeres de 20aos o ms: ? Un IJohn R. Oishei Children'S Hospitalmenor de 18,5 se considera bajo peso. ? Un ICumberland County Hospitalentre 18,5 y 24,9 es normal. ? Un IPelham Medical Centerentre 25 y 29,9 se considera sobrepeso. ? Un IMC de 30 o ms se considera  obesidad. Observe los niveles de colesterol y lpidos en la sangre.  Debe comenzar a rEnglish as a second language teacherde lpidos y cResearch officer, trade unionen la sangre a los 20aos y luego repetirlos cada 516aos  Es posible que nAutomotive engineerlos niveles de colesterol con mayor frecuencia si: ? Sus niveles de lpidos y colesterol son altos. ? Es mayor de 527CWC ? Presenta un alto riesgo de padecer enfermedades cardacas. DETECCIN DE CNCER Cncer de pulmn  Se recomienda realizar exmenes de deteccin de cncer de pulmn a personas adultas entre 574y 892aos que estn en riesgo de dHorticulturist, commercialde pulmn por sus antecedentes de consumo de tabaco.  Se recomienda una tomografa computarizada de baja dosis de los pulmones todos los aos a las personas que: ? Fuman actualmente. ? Hayan dejado el hbito en algn momento en los ltimos 15aos. ? Hayan fumado durante 30aos un paquete diario. Un paquete-ao equivale a fumar un promedio de un paquete de cigarrillos diario durante un ao.  Los exmenes de deteccin anuales deben continuar hasta que hayan pasado 15aos desde que dej de fumar.  Ya no debern realizarse si tiene un problema de salud que le impida recibir tratamiento para eScience writerde pulmn. Cncer de mama  Practique la autoconciencia de la mama. Esto significa reconocer la apariencia normal de sus mamas y cmo las siente.  Tambin significa realizar autoexmenes regulares de lJohnson & Johnson Informe a su mdico sobre cualquier cambio, sin importar cun pequeo sea.  Si tiene entre 20 y 363aos, un mdico debe realizarle un examen clnico de las mamas como parte del examen regular de s[place] cada 1 a  3aos.  Si tiene 40aos o ms, debe realizarse un examen clnico de las mamas todos los aos. Tambin considere realizarse una radiografa de las mamas (mamografa) todos los aos.  Si tiene antecedentes familiares de cncer de mama, hable con su mdico para someterse a un estudio gentico.  Si  tiene alto riesgo de padecer cncer de mama, hable con su mdico para someterse a una resonancia magntica y una mamografa todos los aos.  La evaluacin del gen del cncer de mama (BRCA) se recomienda a mujeres que tengan familiares con cnceres relacionados con el BRCA. Los cnceres relacionados con el BRCA incluyen los siguientes: ? Mama. ? Ovario. ? Trompas. ? Cnceres de peritoneo.  Los resultados de la evaluacin determinarn la necesidad de asesoramiento gentico y de anlisis de BRCA1 y BRCA2. Cncer de cuello del tero El mdico puede recomendarle que se haga pruebas peridicas de deteccin de cncer de los rganos de la pelvis (ovarios, tero y vagina). Estas pruebas incluyen un examen plvico, que abarca controlar si se produjeron cambios microscpicos en la superficie del cuello del tero (prueba de Papanicolaou). Pueden recomendarle que se haga estas pruebas cada 3aos, a partir de los 21aos.  A las mujeres que tienen entre 30 y 65aos, los mdicos pueden recomendarles que se sometan a exmenes plvicos y pruebas de Papanicolaou cada 3aos, o a la prueba de Papanicolaou y el examen plvico en combinacin con estudios de deteccin del virus del papiloma humano (VPH) cada 5aos. Algunos tipos de VPH aumentan el riesgo de padecer cncer de cuello del tero. La prueba para la deteccin del VPH tambin puede realizarse a mujeres de cualquier edad cuyos resultados de la prueba de Papanicolaou no sean claros.  Es posible que otros mdicos no recomienden exmenes de deteccin a mujeres no embarazadas que se consideran sujetos de bajo riesgo de padecer cncer de pelvis y que no tienen sntomas. Pregntele al mdico si un examen plvico de deteccin es adecuado para usted.  Si ha recibido un tratamiento para el cncer cervical o una enfermedad que podra causar cncer, necesitar realizarse una prueba de Papanicolaou y controles durante al menos 20 aos de concluido el tratamiento. Si no se  ha hecho el Papanicolaou con regularidad, debern volver a evaluarse los factores de riesgo (como tener un nuevo compaero sexual), para determinar si debe realizarse los estudios nuevamente. Algunas mujeres sufren problemas mdicos que aumentan la probabilidad de contraer cncer de cuello del tero. En estos casos, el mdico podr indicar que se realicen controles y pruebas de Papanicolaou con ms frecuencia. Cncer colorrectal  Este tipo de cncer puede detectarse y a menudo prevenirse.  Por lo general, los estudios de rutina se deben comenzar a hacer a partir de los 50 aos y hasta los 75 aos.  Sin embargo, el mdico podr aconsejarle que lo haga antes, si tiene factores de riesgo para el cncer de colon.  Tambin puede recomendarle que use un kit de prueba para hallar sangre oculta en la materia fecal.  Es posible que se use una pequea cmara en el extremo de un tubo para examinar directamente el colon (sigmoidoscopia o colonoscopia) a fin de detectar formas tempranas de cncer colorrectal.  Los exmenes de rutina generalmente comienzan a los 50aos.  El examen directo del colon se debe repetir cada 5 a 10aos hasta los 75aos. Sin embargo, es posible que se realicen exmenes con mayor frecuencia, si se detectan formas tempranas de plipos precancerosos o pequeos bultos. Cncer de piel  Revise la piel   de la cabeza a los pies con regularidad.  Informe a su mdico si aparecen nuevos lunares o los que tiene se modifican, especialmente en su forma y color.  Tambin notifique al mdico si tiene un lunar que es ms grande que el tamao de una goma de lpiz.  Siempre use pantalla solar. Aplique pantalla solar de manera libre y repetida a lo largo del da.  Protjase usando mangas y pantalones largos, un sombrero de ala ancha y gafas para el sol, siempre que se encuentre en el exterior. ENFERMEDADES CARDACAS, DIABETES E HIPERTENSIN ARTERIAL  La hipertensin arterial causa  enfermedades cardacas y aumenta el riesgo de ictus. La hipertensin arterial es ms probable en los siguientes casos: ? Las personas que tienen la presin arterial en el extremo del rango normal (100-139/85-89 mm Hg). ? Las personas con sobrepeso u obesidad. ? Las personas afroamericanas.  Si usted tiene entre 18 y 39 aos, debe medirse la presin arterial cada 3 a 5 aos. Si usted tiene 40 aos o ms, debe medirse la presin arterial todos los aos. Debe medirse la presin arterial dos veces: una vez cuando est en un hospital o una clnica y la otra vez cuando est en otro sitio. Registre el promedio de las dos mediciones. Para controlar su presin arterial cuando no est en un hospital o una clnica, puede usar lo siguiente: ? Una mquina automtica para medir la presin arterial en una farmacia. ? Un monitor para medir la presin arterial en el hogar.  Si tiene entre 55 y 79 aos, consulte a su mdico si debe tomar aspirina para prevenir el ictus.  Realcese exmenes de deteccin de la diabetes con regularidad. Esto incluye la toma de una muestra de sangre para controlar el nivel de azcar en la sangre durante el ayuno. ? Si tiene un peso normal y un bajo riesgo de padecer diabetes, realcese este anlisis cada tres aos despus de los 45aos. ? Si tiene sobrepeso y un alto riesgo de padecer diabetes, considere someterse a este anlisis antes o con mayor frecuencia. PREVENCIN DE INFECCIONES HepatitisB  Si tiene un riesgo ms alto de contraer hepatitis B, debe someterse a un examen de deteccin de este virus. Se considera que tiene un alto riesgo de contraer hepatitis B si: ? Naci en un pas donde la hepatitis B es frecuente. Pregntele a su mdico qu pases son considerados de alto riesgo. ? Sus padres nacieron en un pas de alto riesgo y usted no recibi una vacuna que lo proteja contra la hepatitis B (vacuna contra la hepatitis B). ? Tiene VIH o sida. ? Usa agujas para inyectarse  drogas. ? Vive con alguien que tiene hepatitis B. ? Ha tenido sexo con alguien que tiene hepatitis B. ? Recibe tratamiento de hemodilisis. ? Toma ciertos medicamentos para el cncer, trasplante de rganos y afecciones autoinmunitarias. Hepatitis C  Se recomienda un anlisis de sangre para: ? Todos los que nacieron entre 1945 y 1965. ? Todas las personas que tengan un riesgo de haber contrado hepatitis C. Enfermedades de transmisin sexual (ETS).  Debe realizarse pruebas de deteccin de enfermedades de transmisin sexual (ETS), incluidas gonorrea y clamidia si: ? Es sexualmente activo y es menor de 24aos. ? Es mayor de 24aos, y el mdico le informa que corre riesgo de tener este tipo de infecciones. ? La actividad sexual ha cambiado desde que le hicieron la ltima prueba de deteccin y tiene un riesgo mayor de tener clamidia o gonorrea. Pregntele al mdico si usted   tiene riesgo.  Si no tiene el VIH, pero corre riesgo de infectarse por el virus, se recomienda tomar diariamente un medicamento recetado para evitar la infeccin. Esto se conoce como profilaxis previa a la exposicin. Se considera que est en riesgo si: ? Es [place] sexualmente y no [place] preservativos habitualmente o no conoce el estado del VIH de sus Advertising copywriter. ? Se inyecta drogas. ? Es [place] sexualmente con Ardelia Mems pareja que tiene VIH. Consulte a su mdico para saber si tiene un alto riesgo de infectarse por el VIH. Si opta por comenzar la profilaxis previa a la exposicin, primero debe realizarse anlisis de deteccin del VIH. Luego, le harn anlisis cada 34mses mientras est tomando los medicamentos para la profilaxis previa a la exposicin. [facility] Si es premenopusica y puede quedar e[place] solicite a su mdico asesoramiento previo a la concepcin.  Si puede quedar embarazada, tome 400 a [id number](mcg) de cido fAnheuser-Busch  Si desea evitar el embarazo, hable con su mdico sobre el  control de la natalidad (anticoncepcin). OSTEOPOROSIS Y MENOPAUSIA  La osteoporosis es una enfermedad en la que los huesos pierden los minerales y la fuerza por el avance de la edad. El resultado pueden ser fracturas graves en los hSay[place] El riesgo de osteoporosis puede identificarse con uArdelia Memsprueba de densidad sea.  Si tiene 65aos o ms, o si est en riesgo de sufrir osteoporosis y fracturas, pregunte a su mdico si debe someterse a exmenes.  Consulte a su mdico si debe tomar un suplemento de calcio o de vitamina D para reducir el riesgo de osteoporosis.  La menopausia puede presentar ciertos sntomas fsicos y rGaffer  La terapia de reemplazo hormonal puede reducir algunos de estos sntomas y rGaffer Consulte a su mdico para saber si la terapia de reemplazo hormonal es conveniente para usted. INSTRUCCIONES PARA EL CUIDADO EN EL HOGAR  Realcese los estudios de rutina de la salud, dentales y de lPublic librarian  MBath  No consuma ningn producto que contenga tabaco, lo que incluye cigarrillos, tabaco de mHigher education careers advisero cPsychologist, sport and exercise  Si est embarazada, no beba alcohol.  Si est amamantando, reduzca el consumo de alcohol y la frecuencia con la que consume.  Si es mujer y no est embarazada limite el consumo de alcohol a no ms de 1 medida por da. Una medida equivale a 12onzas de cerveza, 5onzas de vino o 1onzas de bebidas alcohlicas de alta graduacin.  No consuma drogas.  No comparta agujas.  Solicite ayuda a su mdico si necesita apoyo o informacin para abandonar las drogas.  Informe a su mdico si a menudo se siente deprimido.  Notifique a su mdico si alguna vez ha sido vctima de abuso o si no se siente seguro en su hogar. Esta informacin no tiene cMarine scientistel consejo del mdico. Asegrese de hacerle al mdico cualquier pregunta que tenga. Document Released: 01/07/2011 Document Revised: 02/08/2014 Document Reviewed:  10/22/2014 Elsevier Interactive Patient Education  2[employer]

## 2017-08-26 LAB — CYTOLOGY - PAP
Diagnosis: NEGATIVE
HPV (WINDOPATH): NOT DETECTED

## 2017-09-02 ENCOUNTER — Telehealth: Payer: Self-pay

## 2017-09-02 NOTE — Telephone Encounter (Signed)
Patient was called and informed of Pap smear [phone]

## 2017-10-25 ENCOUNTER — Telehealth: Payer: Self-pay | Admitting: Family Medicine

## 2017-10-25 NOTE — Telephone Encounter (Signed)
She should contact her pharmacy directly, according to our records she should have enough refills to last until the end of November.

## 2017-10-25 NOTE — Telephone Encounter (Signed)
1) Medication(s) Requested (by name): levothyroxine 2) Pharmacy of Choice: [facility] on [address] 3) Special Requests:   Approved medications will be sent to the pharmacy, we will reach out if there is an issue.  Requests made after 3pm may not be addressed until the following business day!  If a patient is unsure of the name of the medication(s) please note and ask patient to call back when they are able to provide all info, do not send to responsible party until all information is available!

## 2017-10-26 ENCOUNTER — Ambulatory Visit: Payer: BLUE CROSS/BLUE SHIELD | Admitting: Family Medicine

## 2017-10-26 ENCOUNTER — Encounter: Payer: Self-pay | Admitting: Family Medicine

## 2017-10-26 ENCOUNTER — Ambulatory Visit (HOSPITAL_COMMUNITY)
Admission: RE | Admit: 2017-10-26 | Discharge: 2017-10-26 | Disposition: A | Discharge status: Home / Self Care | Payer: BLUE CROSS/BLUE SHIELD | Source: Ambulatory Visit | Attending: Family Medicine | Admitting: Family Medicine

## 2017-10-26 VITALS — BP 116/76 | HR 59 | Temp 97.5°F | Ht 63.0 in | Wt 146.6 lb

## 2017-10-26 DIAGNOSIS — G510 Bell's palsy: Secondary | ICD-10-CM | POA: Insufficient documentation

## 2017-10-26 DIAGNOSIS — Z23 Encounter for immunization: Secondary | ICD-10-CM | POA: Diagnosis not present

## 2017-10-26 MED ORDER — VALACYCLOVIR HCL 1 G PO TABS: 1000.0000 mg | ORAL_TABLET | Freq: Three times a day (TID) | ORAL | 0 refills | Status: DC

## 2017-10-26 MED ORDER — ARTIFICIAL TEARS OPHTHALMIC OINT: TOPICAL_OINTMENT | Freq: Three times a day (TID) | OPHTHALMIC | 1 refills | Status: DC

## 2017-10-26 MED ORDER — PREDNISONE 20 MG PO TABS: 60.0000 mg | ORAL_TABLET | Freq: Every day | ORAL | 0 refills | Status: DC

## 2017-10-26 MED ORDER — ARTIFICIAL TEARS OPHTHALMIC OINT
TOPICAL_OINTMENT | Freq: Three times a day (TID) | OPHTHALMIC | 1 refills | Status: DC
Start: 2017-10-26 — End: 2017-10-26

## 2017-10-26 NOTE — Patient Instructions (Signed)
Parlisis facial  ([place] Palsy)  La parlisis facial es una afeccin en la que se paralizan los msculos de un lado de la cara. Esto a menudo provoca la cada de un lado de la cara. Es una afeccin frecuente y la mayora de las personas se recuperan por completo.  FACTORES DE RIESGO  Los factores de riesgo de la parlisis facial incluyen:   Embarazo.   Diabetes.   Una infeccin provocada por un virus, como las infecciones que causan llagas peribucales.  CAUSAS   La parlisis facial es ocasionada por un dao o una inflamacin de un nervio de la cara. No est claro por qu sucede, pero puede ocurrir a causa de una infeccin provocada por un virus. La mayora de las veces se desconoce la causa.  SIGNOS Y SNTOMAS   Los sntomas pueden variar de leves a graves y pueden durar varias horas. Entre los sntomas se pueden incluir los siguientes:   No poder realizar lo siguiente:    Levantar una o las dos cejas.    Cerrar uno o los dos ojos.    Sentir partes de la cara (entumecimiento facial).   Cada del prpado y la comisura de la boca.   Debilidad en la cara.   Parlisis de la mitad de la cara.   Prdida del gusto.   Sensibilidad a los ruidos fuertes.   Dificultad para masticar.   Lagrimeo del ojo afectado.   Sequedad del ojo afectado.   Babeo.   Dolor detrs de una oreja.  DIAGNSTICO   El diagnstico de la parlisis facial puede incluir:   Un examen fsico y una historia clnica.   Una resonancia magntica.   Una tomografa computarizada.   Electromiograma (EMG). Esta prueba se realiza para controlar el funcionamiento de los nervios.  TRATAMIENTO   El tratamiento puede incluir medicamentos antivirales para ayudar a reducir la duracin de la afeccin. En ocasiones, el tratamiento no es necesario y los sntomas desaparecen por s solos.  INSTRUCCIONES PARA EL CUIDADO EN EL HOGAR    Tome los medicamentos solamente como se lo haya indicado el mdico.   Hgase masajes y realice los ejercicios faciales, como  se lo haya indicado el mdico.   Si el ojo est afectado:    Use gotas oftlmicas con efecto hidratante para prevenir la sequedad del ojo, como se lo haya indicado el mdico.    Proteja el ojo como se lo haya indicado el mdico.  SOLICITE ATENCIN MDICA SI:   Los sntomas no mejoran o empeoran.   Babea.   El ojo est rojo, irritado o le duele.  SOLICITE ATENCIN MDICA DE INMEDIATO SI:    Siente otra parte del cuerpo dbil o adormecida.   Tiene dificultad para tragar.   Tiene fiebre adems de los sntomas de la parlisis facial.   Siente dolor en el cuello.  ASEGRESE DE QUE:    Comprende estas instrucciones.   Controlar su afeccin.   Recibir ayuda de inmediato si no mejora o si empeora.     Esta informacin no tiene como fin reemplazar el consejo del mdico. Asegrese de hacerle al mdico cualquier pregunta que tenga.     Document Released: 01/18/2005 Document Revised: 10/09/2014  Elsevier Interactive Patient Education 2017 Elsevier Inc.

## 2017-10-26 NOTE — Progress Notes (Signed)
Subjective:  Patient ID: Audrey Schwartz, female    DOB: [birth year]  Age: 57 y.o. MRN: [id number]  CC: Eye Pain   HPI Audrey Schwartz is a 57 year old female with a history of hypothyroidism who presents today with a 3-day history of tongue numbness, left eye burning, left-sided headache with drooling from the right side of her mouth.  She denies weakness in her extremities, gait abnormality but endorses an abnormal speech due to feeling her mouth is deviated. She denies fever, recent upper respiratory infections and has not taken any medication for her symptoms.  Past Medical History:  Diagnosis Date  . Bruises easily for a long time   saw doctor, no problem found  . Complication of anesthesia 1997   slow to awaken after c section due to dentures accidentally left in  . Headache(784.0)    and nausea occasionally  . Hypothyroidism   . No pertinent past medical history    PT IS A POOR HISTORIAN   . Thyroid disease    Past Surgical History:  Procedure Laterality Date  . CESAREAN SECTION  1997  . COLONOSCOPY WITH PROPOFOL N/A 08/07/2013   Procedure: COLONOSCOPY WITH PROPOFOL;  Surgeon: Charolett Bumpers, MD;  Location: [facility];  Service: Endoscopy;  Laterality: N/A;      Outpatient Medications Prior to Visit  Medication Sig Dispense Refill  . levothyroxine (SYNTHROID, LEVOTHROID) 88 MCG tablet TAKE 1 TABLET BY MOUTH EVERY DAY BEFORE BREAKFAST 90 tablet 1  . azithromycin (ZITHROMAX) 250 MG tablet Take 2 tabs today then 1 daily (Patient not taking: Reported on 06/30/2017) 6 tablet 0  . benzonatate (TESSALON) 200 MG capsule Take 1 capsule (200 mg total) by mouth 2 (two) times daily as needed for cough. (Patient not taking: Reported on 06/30/2017) 20 capsule 0  . cetirizine (ZYRTEC) 10 MG tablet Take 1 tablet (10 mg total) by mouth daily. (Patient not taking: Reported on 01/13/2017) 30 tablet 1  . ibuprofen (ADVIL,MOTRIN) 200 MG tablet Take 200 mg by mouth every 6 (six)  hours as needed for moderate pain.    Marland Kitchen ibuprofen (ADVIL,MOTRIN) 800 MG tablet Take 1 tablet (800 mg total) by mouth 3 (three) times daily with meals. (Patient not taking: Reported on 06/30/2017) 30 tablet 0  . omeprazole (PRILOSEC) 20 MG capsule Take 1 capsule (20 mg total) by mouth daily. (Patient not taking: Reported on 10/26/2017) 30 capsule 6  . oxyCODONE-acetaminophen (PERCOCET/ROXICET) 5-325 MG tablet Take 1 tablet by mouth every 6 (six) hours as needed for severe pain. (Patient not taking: Reported on 06/30/2017) 10 tablet 0   No facility-administered medications prior to visit.     ROS Review of Systems  Constitutional: Negative for activity change, appetite change and fatigue.  HENT: Negative for congestion, sinus pressure and sore throat.   Eyes: Negative for visual disturbance.  Respiratory: Negative for cough, chest tightness, shortness of breath and wheezing.   Cardiovascular: Negative for chest pain and palpitations.  Gastrointestinal: Negative for abdominal distention, abdominal pain and constipation.  Endocrine: Negative for polydipsia.  Genitourinary: Negative for dysuria and frequency.  Musculoskeletal: Negative for arthralgias and back pain.  Skin: Negative for rash.  Neurological: Positive for facial asymmetry, speech difficulty and headaches. Negative for tremors, light-headedness and numbness.  Hematological: Does not bruise/bleed easily.  Psychiatric/Behavioral: Negative for agitation and behavioral problems.    Objective:  BP 116/76   Pulse (!) 59   Temp (!) 97.5 F (36.4 C) (Oral)   Ht 5\' 3"  (1.6 m)  Wt 146 lb 9.6 oz (66.5 kg)   LMP 02/01/2009 (Approximate)   SpO2 99%   BMI 25.97 kg/m   BP/Weight 10/26/2017 08/25/2017 06/30/2017  Systolic BP 116 109 111  Diastolic BP 76 69 75  Wt. (Lbs) 146.6 145.8 143.4  BMI 25.97 25.83 25.4      Physical Exam  Constitutional: She is oriented to person, place, and time. She appears well-developed and  well-nourished.  Cardiovascular: Normal heart sounds and intact distal pulses. Bradycardia present.  No murmur heard. Pulmonary/Chest: Effort normal and breath sounds normal. She has no wheezes. She has no rales. She exhibits no tenderness.  Abdominal: Soft. Bowel sounds are normal. She exhibits no distension and no mass. There is no tenderness.  Musculoskeletal: Normal range of motion.  Neurological: She is alert and oriented to person, place, and time. She displays normal reflexes. No sensory deficit. She exhibits normal muscle tone. Coordination normal.  Left facial nerve palsy     Assessment & Plan:   1. Bell's palsy We will treat for Bell's palsy and meanwhile would need to exclude stroke. - CT Head Wo Contrast; Future - predniSONE (DELTASONE) 20 MG tablet; Take 3 tablets (60 mg total) by mouth daily with breakfast.  Dispense: 15 tablet; Refill: 0 - valACYclovir (VALTREX) 1000 MG tablet; Take 1 tablet (1,000 mg total) by mouth 3 (three) times daily.  Dispense: 21 tablet; Refill: 0 - artificial tears (LACRILUBE) OINT ophthalmic ointment; Place into the left eye 3 (three) times daily.  Dispense: 3.5 g; Refill: 1   Meds ordered this encounter  Medications  . predniSONE (DELTASONE) 20 MG tablet    Sig: Take 3 tablets (60 mg total) by mouth daily with breakfast.    Dispense:  15 tablet    Refill:  0  . valACYclovir (VALTREX) 1000 MG tablet    Sig: Take 1 tablet (1,000 mg total) by mouth 3 (three) times daily.    Dispense:  21 tablet    Refill:  0  . artificial tears (LACRILUBE) OINT ophthalmic ointment    Sig: Place into the left eye 3 (three) times daily.    Dispense:  3.5 g    Refill:  1    Follow-up: Return in about 1 week (around 11/02/2017) for Follow-up on Bell's palsy.   Hoy Register MD

## 2017-10-27 ENCOUNTER — Encounter: Payer: Self-pay | Admitting: Family Medicine

## 2017-10-28 ENCOUNTER — Telehealth: Payer: Self-pay

## 2017-10-28 NOTE — Telephone Encounter (Signed)
Patient was called and informed of CT scan results. 

## 2017-10-28 NOTE — Telephone Encounter (Signed)
-----   Message from Hoy Register, MD sent at 10/26/2017 12:56 PM EDT ----- CT scan of the head is negative for stroke.  Her symptoms and due to Bell's palsy which I explained to her at her visit

## 2017-11-02 ENCOUNTER — Encounter: Payer: Self-pay | Admitting: Family Medicine

## 2017-11-02 ENCOUNTER — Ambulatory Visit: Payer: BLUE CROSS/BLUE SHIELD | Attending: Family Medicine | Admitting: Family Medicine

## 2017-11-02 VITALS — BP 110/65 | HR 62 | Temp 97.7°F | Ht 63.0 in | Wt 145.6 lb

## 2017-11-02 DIAGNOSIS — E039 Hypothyroidism, unspecified: Secondary | ICD-10-CM | POA: Insufficient documentation

## 2017-11-02 DIAGNOSIS — G510 Bell's palsy: Secondary | ICD-10-CM

## 2017-11-02 DIAGNOSIS — Z79899 Other long term (current) drug therapy: Secondary | ICD-10-CM | POA: Insufficient documentation

## 2017-11-02 DIAGNOSIS — Z79890 Hormone replacement therapy (postmenopausal): Secondary | ICD-10-CM | POA: Insufficient documentation

## 2017-11-02 DIAGNOSIS — Z880 Allergy status to penicillin: Secondary | ICD-10-CM | POA: Insufficient documentation

## 2017-11-02 NOTE — Patient Instructions (Signed)
Parlisis facial  ([place] Palsy)  La parlisis facial es una afeccin en la que se paralizan los msculos de un lado de la cara. Esto a menudo provoca la cada de un lado de la cara. Es una afeccin frecuente y la mayora de las personas se recuperan por completo.  FACTORES DE RIESGO  Los factores de riesgo de la parlisis facial incluyen:   Embarazo.   Diabetes.   Una infeccin provocada por un virus, como las infecciones que causan llagas peribucales.  CAUSAS   La parlisis facial es ocasionada por un dao o una inflamacin de un nervio de la cara. No est claro por qu sucede, pero puede ocurrir a causa de una infeccin provocada por un virus. La mayora de las veces se desconoce la causa.  SIGNOS Y SNTOMAS   Los sntomas pueden variar de leves a graves y pueden durar varias horas. Entre los sntomas se pueden incluir los siguientes:   No poder realizar lo siguiente:    Levantar una o las dos cejas.    Cerrar uno o los dos ojos.    Sentir partes de la cara (entumecimiento facial).   Cada del prpado y la comisura de la boca.   Debilidad en la cara.   Parlisis de la mitad de la cara.   Prdida del gusto.   Sensibilidad a los ruidos fuertes.   Dificultad para masticar.   Lagrimeo del ojo afectado.   Sequedad del ojo afectado.   Babeo.   Dolor detrs de una oreja.  DIAGNSTICO   El diagnstico de la parlisis facial puede incluir:   Un examen fsico y una historia clnica.   Una resonancia magntica.   Una tomografa computarizada.   Electromiograma (EMG). Esta prueba se realiza para controlar el funcionamiento de los nervios.  TRATAMIENTO   El tratamiento puede incluir medicamentos antivirales para ayudar a reducir la duracin de la afeccin. En ocasiones, el tratamiento no es necesario y los sntomas desaparecen por s solos.  INSTRUCCIONES PARA EL CUIDADO EN EL HOGAR    Tome los medicamentos solamente como se lo haya indicado el mdico.   Hgase masajes y realice los ejercicios faciales, como  se lo haya indicado el mdico.   Si el ojo est afectado:    Use gotas oftlmicas con efecto hidratante para prevenir la sequedad del ojo, como se lo haya indicado el mdico.    Proteja el ojo como se lo haya indicado el mdico.  SOLICITE ATENCIN MDICA SI:   Los sntomas no mejoran o empeoran.   Babea.   El ojo est rojo, irritado o le duele.  SOLICITE ATENCIN MDICA DE INMEDIATO SI:    Siente otra parte del cuerpo dbil o adormecida.   Tiene dificultad para tragar.   Tiene fiebre adems de los sntomas de la parlisis facial.   Siente dolor en el cuello.  ASEGRESE DE QUE:    Comprende estas instrucciones.   Controlar su afeccin.   Recibir ayuda de inmediato si no mejora o si empeora.     Esta informacin no tiene como fin reemplazar el consejo del mdico. Asegrese de hacerle al mdico cualquier pregunta que tenga.     Document Released: 01/18/2005 Document Revised: 10/09/2014  Elsevier Interactive Patient Education 2017 Elsevier Inc.

## 2017-11-02 NOTE — Progress Notes (Signed)
Subjective:  Patient ID: Audrey Schwartz, female    DOB: [birth year]  Age: 57 y.o. MRN: [id number]  CC: Facial Swelling   HPI Audrey Schwartz is a 57 year old female with a history of hypothyroidism for follow-up of Bell's palsy.  She had received prednisone and Valtrex at the last office visit for a duration of 7 days and also received artificial tears for her left eye. CT of the head was negative for acute intracranial process. She presents today complaining of persistent tearing of her left eye, left facial pain.  She has no weakness of her upper or lower extremities but her speech is affected due to deviation of her mouth. She has had no fevers.  Past Medical History:  Diagnosis Date  . Bruises easily for a long time   saw doctor, no problem found  . Complication of anesthesia 1997   slow to awaken after c section due to dentures accidentally left in  . Headache(784.0)    and nausea occasionally  . Hypothyroidism   . No pertinent past medical history    PT IS A POOR HISTORIAN   . Thyroid disease     Past Surgical History:  Procedure Laterality Date  . CESAREAN SECTION  1997  . COLONOSCOPY WITH PROPOFOL N/A 08/07/2013   Procedure: COLONOSCOPY WITH PROPOFOL;  Surgeon: Charolett Bumpers, MD;  Location: [facility];  Service: Endoscopy;  Laterality: N/A;    Allergies  Allergen Reactions  . Amoxicillin Nausea And Vomiting    Has patient had a PCN reaction causing immediate rash, facial/tongue/throat swelling, SOB or lightheadedness with hypotension: yes Has patient had a PCN reaction causing severe rash involving mucus membranes or skin necrosis: no Has patient had a PCN reaction that required hospitalization : no Has patient had a PCN reaction occurring within the last 10 years: yes If all of the above answers are "NO", then may proceed with Cephalosporin use.   Marland Kitchen Penicillins Nausea And Vomiting    Has patient had a PCN reaction causing immediate rash,  facial/tongue/throat swelling, SOB or lightheadedness with hypotension: no Has patient had a PCN reaction causing severe rash involving mucus membranes or skin necrosis: no Has patient had a PCN reaction that required hospitalization: no Has patient had a PCN reaction occurring within the last 10 years: no If all of the above answers are "NO", then may proceed with Cephalosporin use.      Outpatient Medications Prior to Visit  Medication Sig Dispense Refill  . levothyroxine (SYNTHROID, LEVOTHROID) 88 MCG tablet TAKE 1 TABLET BY MOUTH EVERY DAY BEFORE BREAKFAST 90 tablet 1  . predniSONE (DELTASONE) 20 MG tablet Take 3 tablets (60 mg total) by mouth daily with breakfast. 15 tablet 0  . valACYclovir (VALTREX) 1000 MG tablet Take 1 tablet (1,000 mg total) by mouth 3 (three) times daily. 21 tablet 0  . artificial tears (LACRILUBE) OINT ophthalmic ointment Place into the left eye 3 (three) times daily. (Patient not taking: Reported on 11/02/2017) 3.5 g 1  . azithromycin (ZITHROMAX) 250 MG tablet Take 2 tabs today then 1 daily (Patient not taking: Reported on 06/30/2017) 6 tablet 0  . benzonatate (TESSALON) 200 MG capsule Take 1 capsule (200 mg total) by mouth 2 (two) times daily as needed for cough. (Patient not taking: Reported on 06/30/2017) 20 capsule 0  . cetirizine (ZYRTEC) 10 MG tablet Take 1 tablet (10 mg total) by mouth daily. (Patient not taking: Reported on 01/13/2017) 30 tablet 1  . ibuprofen (ADVIL,MOTRIN) 200 MG tablet  Take 200 mg by mouth every 6 (six) hours as needed for moderate pain.    Marland Kitchen ibuprofen (ADVIL,MOTRIN) 800 MG tablet Take 1 tablet (800 mg total) by mouth 3 (three) times daily with meals. (Patient not taking: Reported on 06/30/2017) 30 tablet 0  . omeprazole (PRILOSEC) 20 MG capsule Take 1 capsule (20 mg total) by mouth daily. (Patient not taking: Reported on 10/26/2017) 30 capsule 6  . oxyCODONE-acetaminophen (PERCOCET/ROXICET) 5-325 MG tablet Take 1 tablet by mouth every 6 (six)  hours as needed for severe pain. (Patient not taking: Reported on 06/30/2017) 10 tablet 0   No facility-administered medications prior to visit.     ROS Review of Systems  Constitutional: Negative for activity change, appetite change and fatigue.  HENT: Positive for drooling. Negative for congestion, sinus pressure and sore throat.   Eyes: Positive for discharge. Negative for visual disturbance.  Respiratory: Negative for cough, chest tightness, shortness of breath and wheezing.   Cardiovascular: Negative for chest pain and palpitations.  Gastrointestinal: Negative for abdominal distention, abdominal pain and constipation.  Endocrine: Negative for polydipsia.  Genitourinary: Negative for dysuria and frequency.  Musculoskeletal: Negative for arthralgias and back pain.  Skin: Negative for rash.  Neurological: Negative for tremors, light-headedness and numbness.  Hematological: Does not bruise/bleed easily.  Psychiatric/Behavioral: Negative for agitation and behavioral problems.    Objective:  BP 110/65   Pulse 62   Temp 97.7 F (36.5 C) (Oral)   Ht 5\' 3"  (1.6 m)   Wt 145 lb 9.6 oz (66 kg)   LMP 02/01/2009 (Approximate)   SpO2 98%   BMI 25.79 kg/m   BP/Weight 11/02/2017 10/26/2017 08/25/2017  Systolic BP 110 116 109  Diastolic BP 65 76 69  Wt. (Lbs) 145.6 146.6 145.8  BMI 25.79 25.97 25.83      Physical Exam  Constitutional: She is oriented to person, place, and time. She appears well-developed and well-nourished.  Cardiovascular: Normal rate, normal heart sounds and intact distal pulses.  No murmur heard. Pulmonary/Chest: Effort normal and breath sounds normal. She has no wheezes. She has no rales. She exhibits no tenderness.  Abdominal: Soft. Bowel sounds are normal. She exhibits no distension and no mass. There is no tenderness.  Musculoskeletal: Normal range of motion.  Neurological: She is alert and oriented to person, place, and time.  Left facial nerve palsy  Skin:  Skin is warm and dry.     Assessment & Plan:   1. Bell's palsy Completed course of prednisone and Valtrex Continue with artificial tears Explained course of condition to her - Ambulatory referral to Physical Therapy   No orders of the defined types were placed in this encounter.   Follow-up: Return in about 6 weeks (around 12/14/2017).   Hoy Register MD

## 2017-12-22 ENCOUNTER — Ambulatory Visit: Payer: BLUE CROSS/BLUE SHIELD | Attending: Family Medicine | Admitting: Family Medicine

## 2017-12-22 ENCOUNTER — Encounter: Payer: Self-pay | Admitting: Family Medicine

## 2017-12-22 VITALS — BP 102/64 | HR 59 | Temp 97.4°F | Ht 63.0 in | Wt 142.6 lb

## 2017-12-22 DIAGNOSIS — E038 Other specified hypothyroidism: Secondary | ICD-10-CM | POA: Diagnosis not present

## 2017-12-22 DIAGNOSIS — H538 Other visual disturbances: Secondary | ICD-10-CM

## 2017-12-22 DIAGNOSIS — Z79899 Other long term (current) drug therapy: Secondary | ICD-10-CM | POA: Insufficient documentation

## 2017-12-22 DIAGNOSIS — G510 Bell's palsy: Secondary | ICD-10-CM | POA: Diagnosis not present

## 2017-12-22 DIAGNOSIS — Z79890 Hormone replacement therapy (postmenopausal): Secondary | ICD-10-CM | POA: Insufficient documentation

## 2017-12-22 NOTE — Progress Notes (Signed)
Patient states that left eye is watering and painful.

## 2017-12-22 NOTE — Progress Notes (Signed)
Subjective:  Patient ID: Audrey Schwartz, female    DOB: May 02, 1960  Age: 57 y.o. MRN: [id number]  CC: Hypothyroidism   HPI Audrey ManchesterJulia Benitez-Hungate  is a 57 year old female with a history of hypothyroidism for follow-up of Bell's palsy.  She was treated with prednisone and Valtrex in the recent past and also received artificial tears for her left eye but continues to experience tearing from her left eye and some blurry vision.  Also states her palate feels sour. I had referred her to PT however she states the person who contacted her only spoke [place] and she could not understand. She has been compliant with levothyroxine which she uses for hypothyroidism.  Past Medical History:  Diagnosis Date  . Bruises easily for a long time   saw doctor, no problem found  . Complication of anesthesia 1997   slow to awaken after c section due to dentures accidentally left in  . Headache(784.0)    and nausea occasionally  . Hypothyroidism   . No pertinent past medical history    PT IS A POOR HISTORIAN   . Thyroid disease     Past Surgical History:  Procedure Laterality Date  . CESAREAN SECTION  1997  . COLONOSCOPY WITH PROPOFOL N/A 08/07/2013   Procedure: COLONOSCOPY WITH PROPOFOL;  Surgeon: Charolett BumpersMartin K Johnson, MD;  Location: [facility];  Service: Endoscopy;  Laterality: N/A;    Allergies  Allergen Reactions  . Amoxicillin Nausea And Vomiting    Has patient had a PCN reaction causing immediate rash, facial/tongue/throat swelling, SOB or lightheadedness with hypotension: yes Has patient had a PCN reaction causing severe rash involving mucus membranes or skin necrosis: no Has patient had a PCN reaction that required hospitalization : no Has patient had a PCN reaction occurring within the last 10 years: yes If all of the above answers are "NO", then may proceed with Cephalosporin use.   Marland Kitchen. Penicillins Nausea And Vomiting    Has patient had a PCN reaction causing immediate rash,  facial/tongue/throat swelling, SOB or lightheadedness with hypotension: no Has patient had a PCN reaction causing severe rash involving mucus membranes or skin necrosis: no Has patient had a PCN reaction that required hospitalization: no Has patient had a PCN reaction occurring within the last 10 years: no If all of the above answers are "NO", then may proceed with Cephalosporin use.      Outpatient Medications Prior to Visit  Medication Sig Dispense Refill  . ibuprofen (ADVIL,MOTRIN) 200 MG tablet Take 200 mg by mouth every 6 (six) hours as needed for moderate pain.    Marland Kitchen. levothyroxine (SYNTHROID, LEVOTHROID) 88 MCG tablet TAKE 1 TABLET BY MOUTH EVERY DAY BEFORE BREAKFAST 90 tablet 1  . valACYclovir (VALTREX) 1000 MG tablet Take 1 tablet (1,000 mg total) by mouth 3 (three) times daily. 21 tablet 0  . artificial tears (LACRILUBE) OINT ophthalmic ointment Place into the left eye 3 (three) times daily. (Patient not taking: Reported on 11/02/2017) 3.5 g 1  . benzonatate (TESSALON) 200 MG capsule Take 1 capsule (200 mg total) by mouth 2 (two) times daily as needed for cough. (Patient not taking: Reported on 06/30/2017) 20 capsule 0  . cetirizine (ZYRTEC) 10 MG tablet Take 1 tablet (10 mg total) by mouth daily. (Patient not taking: Reported on 01/13/2017) 30 tablet 1  . ibuprofen (ADVIL,MOTRIN) 800 MG tablet Take 1 tablet (800 mg total) by mouth 3 (three) times daily with meals. (Patient not taking: Reported on 06/30/2017) 30 tablet 0  .  omeprazole (PRILOSEC) 20 MG capsule Take 1 capsule (20 mg total) by mouth daily. (Patient not taking: Reported on 10/26/2017) 30 capsule 6  . oxyCODONE-acetaminophen (PERCOCET/ROXICET) 5-325 MG tablet Take 1 tablet by mouth every 6 (six) hours as needed for severe pain. (Patient not taking: Reported on 06/30/2017) 10 tablet 0  . predniSONE (DELTASONE) 20 MG tablet Take 3 tablets (60 mg total) by mouth daily with breakfast. (Patient not taking: Reported on 12/22/2017) 15  tablet 0  . azithromycin (ZITHROMAX) 250 MG tablet Take 2 tabs today then 1 daily (Patient not taking: Reported on 06/30/2017) 6 tablet 0   No facility-administered medications prior to visit.     ROS Review of Systems  Constitutional: Negative for activity change, appetite change and fatigue.  HENT: Negative for congestion, sinus pressure and sore throat.   Eyes: Positive for visual disturbance.  Respiratory: Negative for cough, chest tightness, shortness of breath and wheezing.   Cardiovascular: Negative for chest pain and palpitations.  Gastrointestinal: Negative for abdominal distention, abdominal pain and constipation.  Endocrine: Negative for polydipsia.  Genitourinary: Negative for dysuria and frequency.  Musculoskeletal: Negative for arthralgias and back pain.  Skin: Negative for rash.  Neurological: Negative for tremors, light-headedness and numbness.  Hematological: Does not bruise/bleed easily.  Psychiatric/Behavioral: Negative for agitation and behavioral problems.    Objective:  BP 102/64   Pulse (!) 59   Temp (!) 97.4 F (36.3 C) (Oral)   Ht 5\' 3"  (1.6 m)   Wt 142 lb 9.6 oz (64.7 kg)   LMP 02/01/2009 (Approximate)   SpO2 99%   BMI 25.26 kg/m   BP/Weight 12/22/2017 11/02/2017 10/26/2017  Systolic BP 102 110 116  Diastolic BP 64 65 76  Wt. (Lbs) 142.6 145.6 146.6  BMI 25.26 25.79 25.97      Physical Exam  Constitutional: She is oriented to person, place, and time. She appears well-developed and well-nourished.  Cardiovascular: Normal rate, normal heart sounds and intact distal pulses.  No murmur heard. Pulmonary/Chest: Effort normal and breath sounds normal. She has no wheezes. She has no rales. She exhibits no tenderness.  Abdominal: Soft. Bowel sounds are normal. She exhibits no distension and no mass. There is no tenderness.  Musculoskeletal: Normal range of motion.  Neurological: She is alert and oriented to person, place, and time.  Left facial nerve  palsy  Skin: Skin is warm and dry.  Psychiatric: She has a normal mood and affect.     Assessment & Plan:   1. Other specified hypothyroidism Controlled - TSH - T4, free  2. Bell's palsy Completed course of therapy with Valtrex and prednisone Continued visual to use Discussed community resources for eye exam as she has no medical coverage for referral to ophthalmology She will benefit from PT and I have spoken with the referral coordinator who will be following along with rehab regarding her referral   No orders of the defined types were placed in this encounter.   Follow-up: Return in about 3 months (around 03/24/2018) for folow up of chronic medical conditions.   Hoy Register MD

## 2017-12-23 LAB — T4, FREE: Free T4: 1.82 ng/dL — ABNORMAL HIGH (ref 0.82–1.77)

## 2017-12-23 LAB — TSH: TSH: 1.61 u[IU]/mL (ref 0.450–4.500)

## 2017-12-23 MED ORDER — LEVOTHYROXINE SODIUM 88 MCG PO TABS: ORAL_TABLET | ORAL | 1 refills | Status: DC

## 2018-01-09 ENCOUNTER — Telehealth: Payer: Self-pay

## 2018-01-09 NOTE — Telephone Encounter (Signed)
-----   Message from Hoy RegisterEnobong Newlin, MD sent at 12/23/2017  9:59 AM EST ----- Labs are stable

## 2018-01-09 NOTE — Telephone Encounter (Signed)
pATIENT WAS CALLED AND INFORMED OF LAB RESULTS VIA INTERPRETER [phone].

## 2018-03-27 ENCOUNTER — Ambulatory Visit: Payer: BLUE CROSS/BLUE SHIELD | Admitting: Family Medicine

## 2018-04-13 ENCOUNTER — Other Ambulatory Visit: Payer: Self-pay

## 2018-04-13 ENCOUNTER — Ambulatory Visit: Payer: Self-pay | Attending: Family Medicine | Admitting: Family Medicine

## 2018-04-13 ENCOUNTER — Other Ambulatory Visit: Payer: Self-pay | Admitting: Family Medicine

## 2018-04-13 ENCOUNTER — Encounter: Payer: Self-pay | Admitting: Family Medicine

## 2018-04-13 VITALS — BP 108/69 | HR 61 | Temp 97.5°F | Ht 63.0 in | Wt 135.6 lb

## 2018-04-13 DIAGNOSIS — R6889 Other general symptoms and signs: Secondary | ICD-10-CM

## 2018-04-13 DIAGNOSIS — R05 Cough: Secondary | ICD-10-CM

## 2018-04-13 DIAGNOSIS — R059 Cough, unspecified: Secondary | ICD-10-CM

## 2018-04-13 DIAGNOSIS — E038 Other specified hypothyroidism: Secondary | ICD-10-CM

## 2018-04-13 LAB — POCT INFLUENZA A/B
Influenza A, POC: NEGATIVE
Influenza B, POC: NEGATIVE

## 2018-04-13 MED ORDER — CETIRIZINE HCL 10 MG PO TABS: 10.0000 mg | ORAL_TABLET | Freq: Every day | ORAL | 1 refills | Status: DC

## 2018-04-13 NOTE — Progress Notes (Signed)
Subjective:  Patient ID: Audrey Schwartz, female    DOB: [birth year]  Age: 58 y.o. MRN: [id number]  CC: Hypothyroidism   HPI Audrey Schwartz  is a 58 year old female with a history of hypothyroidism for follow-up visit. She has a 3-day history of cough productive of phlegm which is worse at night, associated pain and subjective fever but denies sinus tenderness, sinus pressure, recent travel also history of sick contacts.  Denies myalgias. She is wondering if she can be tested for Covid-19. Compliant with her levothyroxine and is tolerating her medication okay.  Past Medical History:  Diagnosis Date  . Bruises easily for a long time   saw doctor, no problem found  . Complication of anesthesia 1997   slow to awaken after c section due to dentures accidentally left in  . Headache(784.0)    and nausea occasionally  . Hypothyroidism   . No pertinent past medical history    PT IS A POOR HISTORIAN   . Thyroid disease     Past Surgical History:  Procedure Laterality Date  . CESAREAN SECTION  1997  . COLONOSCOPY WITH PROPOFOL N/A 08/07/2013   Procedure: COLONOSCOPY WITH PROPOFOL;  Surgeon: Charolett Bumpers, MD;  Location: [facility];  Service: Endoscopy;  Laterality: N/A;    History reviewed. No pertinent family history.  Allergies  Allergen Reactions  . Amoxicillin Nausea And Vomiting    Has patient had a PCN reaction causing immediate rash, facial/tongue/throat swelling, SOB or lightheadedness with hypotension: yes Has patient had a PCN reaction causing severe rash involving mucus membranes or skin necrosis: no Has patient had a PCN reaction that required hospitalization : no Has patient had a PCN reaction occurring within the last 10 years: yes If all of the above answers are "NO", then may proceed with Cephalosporin use.   Marland Kitchen Penicillins Nausea And Vomiting    Has patient had a PCN reaction causing immediate rash, facial/tongue/throat swelling, SOB or  lightheadedness with hypotension: no Has patient had a PCN reaction causing severe rash involving mucus membranes or skin necrosis: no Has patient had a PCN reaction that required hospitalization: no Has patient had a PCN reaction occurring within the last 10 years: no If all of the above answers are "NO", then may proceed with Cephalosporin use.     Outpatient Medications Prior to Visit  Medication Sig Dispense Refill  . ibuprofen (ADVIL,MOTRIN) 200 MG tablet Take 200 mg by mouth every 6 (six) hours as needed for moderate pain.    Marland Kitchen levothyroxine (SYNTHROID, LEVOTHROID) 88 MCG tablet TAKE 1 TABLET BY MOUTH EVERY DAY BEFORE BREAKFAST 90 tablet 1  . omeprazole (PRILOSEC) 20 MG capsule Take 1 capsule (20 mg total) by mouth daily. 30 capsule 6  . valACYclovir (VALTREX) 1000 MG tablet Take 1 tablet (1,000 mg total) by mouth 3 (three) times daily. 21 tablet 0  . cetirizine (ZYRTEC) 10 MG tablet Take 1 tablet (10 mg total) by mouth daily. 30 tablet 1  . artificial tears (LACRILUBE) OINT ophthalmic ointment Place into the left eye 3 (three) times daily. (Patient not taking: Reported on 11/02/2017) 3.5 g 1  . benzonatate (TESSALON) 200 MG capsule Take 1 capsule (200 mg total) by mouth 2 (two) times daily as needed for cough. (Patient not taking: Reported on 06/30/2017) 20 capsule 0  . ibuprofen (ADVIL,MOTRIN) 800 MG tablet Take 1 tablet (800 mg total) by mouth 3 (three) times daily with meals. (Patient not taking: Reported on 06/30/2017) 30 tablet 0  . oxyCODONE-acetaminophen (  PERCOCET/ROXICET) 5-325 MG tablet Take 1 tablet by mouth every 6 (six) hours as needed for severe pain. (Patient not taking: Reported on 06/30/2017) 10 tablet 0  . predniSONE (DELTASONE) 20 MG tablet Take 3 tablets (60 mg total) by mouth daily with breakfast. (Patient not taking: Reported on 12/22/2017) 15 tablet 0   No facility-administered medications prior to visit.      ROS Review of Systems  Constitutional: Positive for  fever. Negative for activity change, appetite change and fatigue.  HENT: Negative for congestion, sinus pressure and sore throat.   Eyes: Negative for visual disturbance.  Respiratory: Positive for cough. Negative for chest tightness, shortness of breath and wheezing.   Cardiovascular: Negative for chest pain and palpitations.  Gastrointestinal: Negative for abdominal distention, abdominal pain and constipation.  Endocrine: Negative for polydipsia.  Genitourinary: Negative for dysuria and frequency.  Musculoskeletal: Negative for arthralgias and back pain.  Skin: Negative for rash.  Neurological: Positive for weakness. Negative for tremors, light-headedness and numbness.  Hematological: Does not bruise/bleed easily.  Psychiatric/Behavioral: Negative for agitation and behavioral problems.    Objective:  BP 108/69   Pulse 61   Temp (!) 97.5 F (36.4 C) (Oral)   Ht 5\' 3"  (1.6 m)   Wt 135 lb 9.6 oz (61.5 kg)   LMP 02/01/2009 (Approximate)   SpO2 100%   BMI 24.02 kg/m   BP/Weight 04/13/2018 12/22/2017 11/02/2017  Systolic BP 108 102 110  Diastolic BP 69 64 65  Wt. (Lbs) 135.6 142.6 145.6  BMI 24.02 25.26 25.79      Physical Exam Constitutional:      Appearance: She is well-developed.  HENT:     Head:     Comments: Mild oropharyngeal erythema Cardiovascular:     Rate and Rhythm: Normal rate.     Heart sounds: Normal heart sounds. No murmur.  Pulmonary:     Effort: Pulmonary effort is normal.     Breath sounds: Normal breath sounds. No wheezing or rales.  Chest:     Chest wall: No tenderness.  Abdominal:     General: Bowel sounds are normal. There is no distension.     Palpations: Abdomen is soft. There is no mass.     Tenderness: There is no abdominal tenderness.  Musculoskeletal: Normal range of motion.  Neurological:     Mental Status: She is alert and oriented to person, place, and time.     CMP Latest Ref Rng & Units 06/30/2017 06/24/2016 10/30/2015  Glucose 65 -  99 mg/dL 92 94 92  BUN 6 - 24 mg/dL 12 16 14   Creatinine 0.57 - 1.00 mg/dL 1.49 7.02 6.37  Sodium 134 - 144 mmol/L 142 139 138  Potassium 3.5 - 5.2 mmol/L 4.2 4.1 4.7  Chloride 96 - 106 mmol/L 104 100 104  CO2 20 - 29 mmol/L 23 27 26   Calcium 8.7 - 10.2 mg/dL 9.4 9.1 9.0  Total Protein 6.0 - 8.5 g/dL 7.3 6.9 6.8  Total Bilirubin 0.0 - 1.2 mg/dL 0.6 0.3 0.4  Alkaline Phos 39 - 117 IU/L 90 74 62  AST 0 - 40 IU/L 21 21 20   ALT 0 - 32 IU/L 9 9 8     Lipid Panel     Component Value Date/Time   CHOL 198 06/30/2017 1147   TRIG 124 06/30/2017 1147   HDL 51 06/30/2017 1147   CHOLHDL 3.9 06/30/2017 1147   CHOLHDL 3.4 10/30/2015 1004   VLDL 15 10/30/2015 1004   LDLCALC 122 (H) 06/30/2017  1147    CBC    Component Value Date/Time   WBC 6.3 10/30/2015 1004   RBC 4.69 10/30/2015 1004   HGB 13.6 10/30/2015 1004   HCT 39.9 10/30/2015 1004   PLT 253 10/30/2015 1004   MCV 85.1 10/30/2015 1004   MCV 85.9 03/10/2015 1216   MCH 29.0 10/30/2015 1004   MCHC 34.1 10/30/2015 1004   RDW 13.3 10/30/2015 1004   LYMPHSABS 2,709 10/30/2015 1004   MONOABS 504 10/30/2015 1004   EOSABS 126 10/30/2015 1004   BASOSABS 0 10/30/2015 1004    Lab Results  Component Value Date   HGBA1C 6.3 (H) 02/26/2009    Assessment & Plan:   1. Other specified hypothyroidism Controlled - TSH - T4, free - Basic Metabolic Panel  2. Flu-like symptoms Rapid flu is negative Advised she is low risk for COVID-19; no indication to test at this time. - POCT Influenza A/B  3. Cough - cetirizine (ZYRTEC) 10 MG tablet; Take 1 tablet (10 mg total) by mouth daily.  Dispense: 30 tablet; Refill: 1   Meds ordered this encounter  Medications  . cetirizine (ZYRTEC) 10 MG tablet    Sig: Take 1 tablet (10 mg total) by mouth daily.    Dispense:  30 tablet    Refill:  1    Follow-up: Return in about 3 months (around 07/14/2018) for Chronic Medical Conditions.       Hoy Register, MD, FAAFP. [facility] [place], [place] [phone]   04/13/2018, 1:05 PM

## 2018-04-14 ENCOUNTER — Telehealth: Payer: Self-pay

## 2018-04-14 LAB — BASIC METABOLIC PANEL
BUN/Creatinine Ratio: 20 (ref 12–28)
BUN: 14 mg/dL (ref 8–27)
CO2: 26 mmol/L (ref 20–29)
Calcium: 9 mg/dL (ref 8.7–10.3)
Chloride: 100 mmol/L (ref 96–106)
Creatinine, Ser: 0.69 mg/dL (ref 0.57–1.00)
GFR calc Af Amer: 109 mL/min/{1.73_m2} (ref >59)
GFR calc non Af Amer: 95 mL/min/{1.73_m2} (ref >59)
GLUCOSE: 95 mg/dL (ref 65–99)
POTASSIUM: 3.9 mmol/L (ref 3.5–5.2)
SODIUM: 136 mmol/L (ref 134–144)

## 2018-04-14 LAB — T4, FREE: FREE T4: 1.88 ng/dL — AB (ref 0.82–1.77)

## 2018-04-14 LAB — TSH: TSH: 0.482 u[IU]/mL (ref 0.450–4.500)

## 2018-04-14 MED ORDER — LEVOTHYROXINE SODIUM 88 MCG PO TABS: ORAL_TABLET | ORAL | 1 refills | Status: DC

## 2018-04-14 NOTE — Telephone Encounter (Signed)
-----   Message from Hoy Register, MD sent at 04/13/2018  2:36 PM EDT ----- Please inform her she tested negative for the flu

## 2018-04-14 NOTE — Telephone Encounter (Signed)
-----   Message from Hoy Register, MD sent at 04/14/2018 10:02 AM EDT ----- Labs are normal

## 2018-04-14 NOTE — Telephone Encounter (Signed)
Patient was called and informed of lab results. Patient had no questions.([id number]) 

## 2018-04-20 ENCOUNTER — Telehealth: Payer: Self-pay | Admitting: Family Medicine

## 2018-04-20 DIAGNOSIS — R05 Cough: Secondary | ICD-10-CM

## 2018-04-20 DIAGNOSIS — R059 Cough, unspecified: Secondary | ICD-10-CM

## 2018-04-20 NOTE — Telephone Encounter (Signed)
1) Medication(s) Requested (by name): cetirizine (ZYRTEC) 10 MG tablet  Patient states the pharmacy did not receive the prescription 2) Pharmacy of Choice:  [id number] - [place], [place] - [address]

## 2018-04-21 MED ORDER — CETIRIZINE HCL 10 MG PO TABS: 10.0000 mg | ORAL_TABLET | Freq: Every day | ORAL | 2 refills | Status: DC

## 2018-04-25 ENCOUNTER — Telehealth: Payer: Self-pay | Admitting: Family Medicine

## 2018-04-25 DIAGNOSIS — K2960 Other gastritis without bleeding: Secondary | ICD-10-CM

## 2018-04-25 MED ORDER — OMEPRAZOLE 20 MG PO CPDR: 20.0000 mg | DELAYED_RELEASE_CAPSULE | Freq: Every day | ORAL | 2 refills | Status: DC

## 2018-04-25 NOTE — Telephone Encounter (Signed)
1) Medication(s) Requested (by name): omeprazole (PRILOSEC) 20 MG capsule    2) Pharmacy of Choice: [facility]   3) Special Requests:   Approved medications will be sent to the pharmacy, we will reach out if there is an issue.  Requests made after 3pm may not be addressed until the following business day!  If a patient is unsure of the name of the medication(s) please note and ask patient to call back when they are able to provide all info, do not send to responsible party until all information is available!

## 2018-06-04 ENCOUNTER — Ambulatory Visit (HOSPITAL_COMMUNITY)
Admission: EM | Admit: 2018-06-04 | Discharge: 2018-06-04 | Disposition: A | Discharge status: Home / Self Care | Payer: BLUE CROSS/BLUE SHIELD | Attending: Family Medicine | Admitting: Family Medicine

## 2018-06-04 ENCOUNTER — Encounter (HOSPITAL_COMMUNITY): Payer: Self-pay | Admitting: Emergency Medicine

## 2018-06-04 ENCOUNTER — Other Ambulatory Visit: Payer: Self-pay

## 2018-06-04 DIAGNOSIS — L03116 Cellulitis of left lower limb: Secondary | ICD-10-CM | POA: Diagnosis not present

## 2018-06-04 MED ORDER — CEPHALEXIN 500 MG PO CAPS: 500.0000 mg | ORAL_CAPSULE | Freq: Four times a day (QID) | ORAL | 0 refills | Status: AC

## 2018-06-04 MED ORDER — SULFAMETHOXAZOLE-TRIMETHOPRIM 800-160 MG PO TABS: 1.0000 | ORAL_TABLET | Freq: Two times a day (BID) | ORAL | 0 refills | Status: AC

## 2018-06-04 NOTE — ED Triage Notes (Signed)
Red rash to left lower leg.  Rash goes around lower leg, and area of redness traveling up shin of left leg.  Reports rash itches and is painful

## 2018-06-04 NOTE — Discharge Instructions (Signed)
Please begin taking Keflex 4 times a day for the next week Please also begin Bactrim twice daily for the next week  Please monitor redness swelling and pain, temperature, if symptoms progressing, worsening, spreading despite being on the above antibiotics please follow-up.  I would expect to see some improvement in pain and redness in 48 hours

## 2018-06-04 NOTE — ED Provider Notes (Signed)
[facility]    CSN: [id number] Arrival date & time: 06/04/18  1416     History   Chief Complaint Chief Complaint  Patient presents with  . Rash    HPI Audrey Schwartz is a 58 y.o. female history of hypothyroidism, presenting today for evaluation of left leg redness.  Patient states that over the past 4 days she has developed increased redness pain and swelling to her left lower leg.  She is also had subjective fevers and had hot and cold chills.  No known temperature measurement.  She has been applying penicillin cream and taking tetracycline by mouth which she began yesterday.  Denies difficulty moving joints including knee and ankle, did not as numbness of foot, but has had some tingling sensations around the area of redness.  It is also been very itchy.  HPI  Past Medical History:  Diagnosis Date  . Bruises easily for a long time   saw doctor, no problem found  . Complication of anesthesia 1997   slow to awaken after c section due to dentures accidentally left in  . Headache(784.0)    and nausea occasionally  . Hypothyroidism   . No pertinent past medical history    PT IS A POOR HISTORIAN   . Thyroid disease     Patient Active Problem List   Diagnosis Date Noted  . Bell's palsy 12/22/2017  . Unspecified hypothyroidism 12/01/2012  . Annual physical exam 12/01/2012  . Contact dermatitis 12/01/2012  . Hypothyroidism 05/26/2012  . Hematuria 05/26/2012  . Suprapubic tenderness 05/26/2012    Past Surgical History:  Procedure Laterality Date  . CESAREAN SECTION  1997  . COLONOSCOPY WITH PROPOFOL N/A 08/07/2013   Procedure: COLONOSCOPY WITH PROPOFOL;  Surgeon: Charolett Bumpers, MD;  Location: [facility];  Service: Endoscopy;  Laterality: N/A;    OB History    Gravida  8   Para  7   Term  6   Preterm  1   AB  1   Living  6     SAB  1   TAB      Ectopic      Multiple      Live Births               Home Medications    Prior  to Admission medications   Medication Sig Start Date End Date Taking? Authorizing Provider  acetaminophen (TYLENOL) 325 MG tablet Take 650 mg by mouth every 6 (six) hours as needed.   Yes [provider]  NON FORMULARY tetraciclina-and penicilina topical   Yes [provider]  cephALEXin (KEFLEX) 500 MG capsule Take 1 capsule (500 mg total) by mouth 4 (four) times daily for 7 days. 06/04/18 06/11/18  ,  C, PA-C  cetirizine (ZYRTEC) 10 MG tablet Take 1 tablet (10 mg total) by mouth daily. 04/21/18   Hoy Register, MD  levothyroxine (SYNTHROID, LEVOTHROID) 88 MCG tablet TAKE 1 TABLET BY MOUTH EVERY DAY BEFORE BREAKFAST 04/14/18   Hoy Register, MD  omeprazole (PRILOSEC) 20 MG capsule Take 1 capsule (20 mg total) by mouth daily. 04/25/18   Hoy Register, MD  sulfamethoxazole-trimethoprim (BACTRIM DS) 800-160 MG tablet Take 1 tablet by mouth 2 (two) times daily for 7 days. 06/04/18 06/11/18  ,  C, PA-C  valACYclovir (VALTREX) 1000 MG tablet Take 1 tablet (1,000 mg total) by mouth 3 (three) times daily. 10/26/17   Hoy Register, MD    Family History History reviewed. No pertinent family  history.  Social History Social History   Tobacco Use  . Smoking status: Never Smoker  . Smokeless tobacco: Never Used  Substance Use Topics  . Alcohol use: No  . Drug use: No     Allergies   Amoxicillin and Penicillins   Review of Systems Review of Systems  Constitutional: Positive for chills and fever. Negative for fatigue.  Eyes: Negative for visual disturbance.  Respiratory: Negative for shortness of breath.   Cardiovascular: Negative for chest pain.  Gastrointestinal: Negative for abdominal pain, nausea and vomiting.  Musculoskeletal: Negative for arthralgias and joint swelling.  Skin: Positive for color change and rash. Negative for wound.  Neurological: Negative for dizziness, weakness, light-headedness and headaches.     Physical Exam Triage  Vital Signs ED Triage Vitals  Enc Vitals Group     BP 06/04/18 1434 115/74     Pulse Rate 06/04/18 1434 77     Resp 06/04/18 1434 16     Temp 06/04/18 1434 98.5 F (36.9 C)     Temp Source 06/04/18 1434 Oral     SpO2 06/04/18 1434 96 %     Weight --      Height --      Head Circumference --      Peak Flow --      Pain Score 06/04/18 1430 5     Pain Loc --      Pain Edu? --      Excl. in GC? --    No data found.  Updated Vital Signs BP 115/74 (BP Location: Right Arm)   Pulse 77   Temp 98.5 F (36.9 C) (Oral)   Resp 16   LMP 02/01/2009 (Approximate)   SpO2 96%   Visual Acuity Right Eye Distance:   Left Eye Distance:   Bilateral Distance:    Right Eye Near:   Left Eye Near:    Bilateral Near:     Physical Exam Vitals signs and nursing note reviewed.  Constitutional:      Appearance: She is well-developed.     Comments: No acute distress  HENT:     Head: Normocephalic and atraumatic.     Nose: Nose normal.  Eyes:     Conjunctiva/sclera: Conjunctivae normal.  Neck:     Musculoskeletal: Neck supple.  Cardiovascular:     Rate and Rhythm: Normal rate.  Pulmonary:     Effort: Pulmonary effort is normal. No respiratory distress.  Abdominal:     General: There is no distension.  Musculoskeletal: Normal range of motion.     Comments: Full active range of motion of knee and ankle, able to wiggle toes No calf tenderness or erythema, negative Homans  Skin:    General: Skin is warm and dry.     Comments: See picture below, area of erythema and warmth and mild swelling to anterior shin, wrapping circumferentially to lower area of erythema, appears to be spreading more proximally  Dorsalis pedis 2+, cap refill brisk,  Neurological:     Mental Status: She is alert and oriented to person, place, and time.          UC Treatments / Results  Labs (all labs ordered are listed, but only abnormal results are displayed) Labs Reviewed - No data to display  EKG  None  Radiology No results found.  Procedures Procedures (including critical care time)  Medications Ordered in UC Medications - No data to display  Initial Impression / Assessment and Plan / UC Course  I have reviewed the triage vital signs and the nursing notes.  Pertinent labs & imaging results that were available during my care of the patient were reviewed by me and considered in my medical decision making (see chart for details).     Vital signs stable without fever or tachycardia. Patient with signs suggestive of cellulitis, symptoms mostly anterior, feel DVT less likely.  Will initiate on Keflex and Bactrim.  Continue to monitor redness, swelling, pain and temperature,Discussed strict return precautions. Patient verbalized understanding and is agreeable with plan.  Final Clinical Impressions(s) / UC Diagnoses   Final diagnoses:  Cellulitis of left lower extremity     Discharge Instructions     Please begin taking Keflex 4 times a day for the next week Please also begin Bactrim twice daily for the next week  Please monitor redness swelling and pain, temperature, if symptoms progressing, worsening, spreading despite being on the above antibiotics please follow-up.  I would expect to see some improvement in pain and redness in 48 hours   ED Prescriptions    Medication Sig Dispense Auth. Provider   cephALEXin (KEFLEX) 500 MG capsule Take 1 capsule (500 mg total) by mouth 4 (four) times daily for 7 days. 28 capsule ,  C, PA-C   sulfamethoxazole-trimethoprim (BACTRIM DS) 800-160 MG tablet Take 1 tablet by mouth 2 (two) times daily for 7 days. 14 tablet , [place] C, PA-C     Controlled Substance Prescriptions [place] Controlled Substance Registry consulted? Not Applicable   Lew Dawes, [place] 06/04/18 1457

## 2018-07-20 ENCOUNTER — Other Ambulatory Visit: Payer: Self-pay | Admitting: Family Medicine

## 2018-07-20 ENCOUNTER — Encounter: Payer: Self-pay | Admitting: Family Medicine

## 2018-07-20 ENCOUNTER — Ambulatory Visit: Payer: BLUE CROSS/BLUE SHIELD | Attending: Family Medicine | Admitting: Family Medicine

## 2018-07-20 ENCOUNTER — Other Ambulatory Visit: Payer: Self-pay

## 2018-07-20 VITALS — BP 105/68 | HR 67 | Temp 97.6°F | Ht 63.0 in | Wt 130.4 lb

## 2018-07-20 DIAGNOSIS — G4709 Other insomnia: Secondary | ICD-10-CM

## 2018-07-20 DIAGNOSIS — E038 Other specified hypothyroidism: Secondary | ICD-10-CM

## 2018-07-20 DIAGNOSIS — K2960 Other gastritis without bleeding: Secondary | ICD-10-CM

## 2018-07-20 MED ORDER — OMEPRAZOLE 20 MG PO CPDR: 20.0000 mg | DELAYED_RELEASE_CAPSULE | Freq: Every day | ORAL | 2 refills | Status: DC

## 2018-07-20 NOTE — Patient Instructions (Signed)
Insomnio  Insomnia  El insomnio es un trastorno del sueo que causa dificultades para conciliar el sueo o para mantenerlo. Puede producir fatiga, falta de energa, dificultad para concentrarse, cambios en el estado de nimo y mal rendimiento escolar o laboral.  Hay tres formas diferentes de clasificar el insomnio:   Dificultad para conciliar el sueo.   Dificultad para mantener el sueo.   Despertar muy precoz por la maana.  Cualquier tipo de insomnio puede ser a largo plazo (crnico) o a corto plazo (agudo). Ambos son frecuentes. Generalmente, el insomnio a corto plazo dura tres meses o menos tiempo. El crnico ocurre al menos tres veces por semana durante ms de tres meses.  Cules son las causas?  El insomnio puede deberse a otra afeccin, situacin o sustancia, por ejemplo:   Ansiedad.   Ciertos medicamentos.   Enfermedad de reflujo gastroesofgico (ERGE) u otras enfermedades gastrointestinales.   Asma y otras enfermedades respiratorias.   Sndrome de las piernas inquietas, apnea del sueo u otros trastornos del sueo.   Dolor crnico.   Menopausia.   Accidente cerebrovascular.   Consumo excesivo de alcohol, tabaco u drogas ilegales.   Afecciones de salud mental, como depresin.   Cafena.   Trastornos neurolgicos, como enfermedad de Alzheimer.   Hiperactividad de la glndula tiroidea (hipertiroidismo).  En ocasiones, la causa del insomnio puede ser desconocida.  Qu incrementa el riesgo?  Los factores de riesgo de tener insomnio incluyen lo siguiente:   Sexo. La enfermedad afecta ms a menudo a las mujeres que a los hombres.   Edad. El insomnio es ms frecuente a medida que una persona envejece.   Estrs.   La falta de actividad fsica.   Los horarios de trabajo irregulares o los turnos nocturnos.   Los viajes a lugares de diferentes zonas horarias.   Ciertas afecciones mdicas y de salud mental.  Cules son los signos o los sntomas?  Si tiene insomnio, el sntoma principal es la  dificultad para conciliar el sueo o mantenerlo. Esto puede derivar en otros sntomas, por ejemplo:   Sentirse fatigado o tener poca energa.   Ponerse nervioso por tener que irse a dormir.   No sentirse descansado por la maana.   Tener dificultad para concentrarse.   Sentirse irritable, ansioso o deprimido.  Cmo se diagnostica?  Esta afeccin se puede diagnosticar en funcin de lo siguiente:   Los sntomas y antecedentes mdicos. El mdico puede hacerle preguntas sobre:  ? Hbitos de sueo.  ? Cualquier afeccin mdica que tenga.  ? La salud mental.   Un examen fsico.  Cmo se trata?  El tratamiento para el insomnio depende de la causa. El tratamiento puede centrarse en tratar una afeccin preexistente que causa el insomnio. El tratamiento tambin puede incluir lo siguiente:   Medicamentos que lo ayuden a dormir.   Asesoramiento psicolgico o terapia.   Ajustes en el estilo de vida para ayudarlo a dormir mejor.  Siga estas indicaciones en su casa:  Comida y bebida     Limite o evite el consumo de alcohol, bebidas con cafena y cigarrillos, especialmente cerca de la hora de acostarse, ya que pueden perturbarle el sueo.   No consuma una comida suculenta ni coma alimentos condimentados justo antes de la hora de acostarse. Esto puede causarle molestias digestivas y dificultades para dormir.  Hbitos de sueo     Lleve un registro del sueo ya que podra ser de utilidad para que usted y a su mdico puedan determinar qu podra   estar causndole insomnio. Escriba los siguientes datos:  ? Cundo duerme.  ? Cundo se despierta durante la noche.  ? Qu tan bien duerme.  ? Qu tan relajado se siente al da siguiente.  ? Cualquier efecto secundario de los medicamentos que toma.  ? Lo que usted come y bebe.   Convierta su habitacin en un lugar oscuro, cmodo donde sea fcil conciliar el sueo.  ? Coloque persianas o cortinas oscuras que impidan la entrada de la luz del exterior.  ? Para bloquear los ruidos,  use un aparato que reproduzca sonidos ambientales o relajantes de fondo.  ? Mantenga baja la temperatura.   Limite el uso de pantallas antes de la hora de acostarse. Esto incluye lo siguiente:  ? Mirar televisin.  ? Usar el telfono inteligente, la tableta o la computadora.   Siga una rutina que incluya ir a dormir y despertarse a la misma hora cada da y noche. Esto puede ayudarlo a conciliar el sueo ms rpidamente. Considere realizar una actividad tranquila, como leer, e incorporarla como parte de la rutina a la hora de irse a dormir.   Trate de evitar tomar siestas durante el da para que pueda dormir mejor por la noche.   Levntese de la cama si sigue despierto despus de 15minutos de haber intentado dormirse. Mantenga bajas las luces, pero intente leer o hacer una actividad tranquila. Cuando tenga sueo, regrese a la cama.  Instrucciones generales   Tome los medicamentos de venta libre y los recetados solamente como se lo haya indicado el mdico.   Realice ejercicio con regularidad como se lo haya indicado el mdico. Evite la actividad fsica desde varias horas antes de irse a dormir.   Utilice tcnicas de relajacin para controlar el estrs. Pdale al mdico que le sugiera algunas tcnicas que sean adecuadas para usted. Estos pueden incluir lo siguiente:  ? Ejercicios de respiracin.  ? Rutinas para aliviar la tensin muscular.  ? Visualizacin de escenas apacibles.   Conduzca con cuidado. No conduzca si est muy somnoliento.   Concurra a todas las visitas de control como se lo haya indicado el mdico. Esto es importante.  Comunquese con un mdico si:   Est cansado durante todo el da.   Tiene dificultad en su rutina diaria debido a la somnolencia.   Sigue teniendo problemas para dormir o estos empeoran.  Solicite ayuda de inmediato si:   Piensa seriamente en lastimarse a usted mismo o a otra persona.  Si alguna vez siente que puede lastimarse a usted mismo o a otras personas, o tiene  pensamientos de poner fin a su vida, busque ayuda de inmediato. Puede dirigirse al servicio de emergencias ms cercano o comunicarse con:   El servicio de emergencias de su localidad (911 en [place]   Una lnea de asistencia al suicida y atencin en crisis, como la Lnea Nacional de Prevencin del Suicidio (National Suicide Prevention Lifeline), al 1-800-273-8255. Est disponible las 24 horas del da.  Resumen   El insomnio es un trastorno del sueo que causa dificultades para conciliar el sueo o para mantenerlo.   El insomnio puede ser a largo plazo (crnico) o a corto plazo (agudo).   El tratamiento para el insomnio depende de la causa. El tratamiento puede centrarse en tratar una afeccin preexistente que causa el insomnio.   Lleve un registro del sueo ya que podra ser de utilidad para que usted y a su mdico puedan determinar qu podra estar causndole insomnio.  Esta informacin no tiene   como fin reemplazar el consejo del mdico. Asegrese de hacerle al mdico cualquier pregunta que tenga.  Document Released: 01/18/2005 Document Revised: 01/07/2017 Document Reviewed: 01/07/2017  Elsevier Interactive Patient Education  2019 [employer]

## 2018-07-20 NOTE — Progress Notes (Signed)
Subjective:  Patient ID: Audrey Schwartz, female    DOB: [birth year]  Age: 58 y.o. MRN: [id number]  CC: Hypothyroidism   HPI Audrey Schwartz is a 58 year old female with a history of hypothyroidism, GERD for follow-up visit.  She complains of insomnia for the last 2 weeks and denies intake of caffeinated products, does not take daytime naps. She works every day and is tired by the time she gets home but has difficulty falling asleep. She denies any change in her circumstances recently.  Last thyroid panel was normal and she endorses compliance with levothyroxine.  Denies heat or cold intolerance. She denies chest pain, dyspnea and has no other additional concerns. Her reflux is controlled on her PPI.  Past Medical History:  Diagnosis Date  . Bruises easily for a long time   saw doctor, no problem found  . Complication of anesthesia 1997   slow to awaken after c section due to dentures accidentally left in  . Headache(784.0)    and nausea occasionally  . Hypothyroidism   . No pertinent past medical history    PT IS A POOR HISTORIAN   . Thyroid disease     Past Surgical History:  Procedure Laterality Date  . CESAREAN SECTION  1997  . COLONOSCOPY WITH PROPOFOL N/A 08/07/2013   Procedure: COLONOSCOPY WITH PROPOFOL;  Surgeon: Charolett BumpersMartin K Johnson, MD;  Location: [facility];  Service: Endoscopy;  Laterality: N/A;    History reviewed. No pertinent family history.  Allergies  Allergen Reactions  . Amoxicillin Nausea And Vomiting    Has patient had a PCN reaction causing immediate rash, facial/tongue/throat swelling, SOB or lightheadedness with hypotension: yes Has patient had a PCN reaction causing severe rash involving mucus membranes or skin necrosis: no Has patient had a PCN reaction that required hospitalization : no Has patient had a PCN reaction occurring within the last 10 years: yes If all of the above answers are "NO", then may proceed with Cephalosporin use.    Marland Kitchen. Penicillins Nausea And Vomiting    Has patient had a PCN reaction causing immediate rash, facial/tongue/throat swelling, SOB or lightheadedness with hypotension: no Has patient had a PCN reaction causing severe rash involving mucus membranes or skin necrosis: no Has patient had a PCN reaction that required hospitalization: no Has patient had a PCN reaction occurring within the last 10 years: no If all of the above answers are "NO", then may proceed with Cephalosporin use.     Outpatient Medications Prior to Visit  Medication Sig Dispense Refill  . acetaminophen (TYLENOL) 325 MG tablet Take 650 mg by mouth every 6 (six) hours as needed.    . cetirizine (ZYRTEC) 10 MG tablet Take 1 tablet (10 mg total) by mouth daily. 30 tablet 2  . levothyroxine (SYNTHROID, LEVOTHROID) 88 MCG tablet TAKE 1 TABLET BY MOUTH EVERY DAY BEFORE BREAKFAST 90 tablet 1  . NON FORMULARY tetraciclina-and penicilina topical    . valACYclovir (VALTREX) 1000 MG tablet Take 1 tablet (1,000 mg total) by mouth 3 (three) times daily. 21 tablet 0  . omeprazole (PRILOSEC) 20 MG capsule Take 1 capsule (20 mg total) by mouth daily. 30 capsule 2   No facility-administered medications prior to visit.      ROS Review of Systems  Constitutional: Negative for activity change, appetite change and fatigue.  HENT: Negative for congestion, sinus pressure and sore throat.   Eyes: Negative for visual disturbance.  Respiratory: Negative for cough, chest tightness, shortness of breath and wheezing.  Cardiovascular: Negative for chest pain and palpitations.  Gastrointestinal: Negative for abdominal distention, abdominal pain and constipation.  Endocrine: Negative for polydipsia.  Genitourinary: Negative for dysuria and frequency.  Musculoskeletal: Negative for arthralgias and back pain.  Skin: Negative for rash.  Neurological: Negative for tremors, light-headedness and numbness.  Hematological: Does not bruise/bleed easily.   Psychiatric/Behavioral: Negative for agitation and behavioral problems.    Objective:  BP 105/68   Pulse 67   Temp 97.6 F (36.4 C) (Oral)   Ht 5\' 3"  (1.6 m)   Wt 130 lb 6.4 oz (59.1 kg)   LMP 02/01/2009 (Approximate)   SpO2 97%   BMI 23.10 kg/m   BP/Weight 07/20/2018 06/04/2018 1/69/6789  Systolic BP 381 017 510  Diastolic BP 68 74 69  Wt. (Lbs) 130.4 - 135.6  BMI 23.1 - 24.02      Physical Exam Constitutional:      Appearance: She is well-developed.  Cardiovascular:     Rate and Rhythm: Normal rate.     Heart sounds: Normal heart sounds. No murmur.  Pulmonary:     Effort: Pulmonary effort is normal.     Breath sounds: Normal breath sounds. No wheezing or rales.  Chest:     Chest wall: No tenderness.  Abdominal:     General: Bowel sounds are normal. There is no distension.     Palpations: Abdomen is soft. There is no mass.     Tenderness: There is no abdominal tenderness.  Musculoskeletal: Normal range of motion.  Neurological:     Mental Status: She is alert and oriented to person, place, and time.     CMP Latest Ref Rng & Units 04/13/2018 06/30/2017 06/24/2016  Glucose 65 - 99 mg/dL 95 92 94  BUN 8 - 27 mg/dL 14 12 16   Creatinine 0.57 - 1.00 mg/dL 0.69 0.69 0.80  Sodium 134 - 144 mmol/L 136 142 139  Potassium 3.5 - 5.2 mmol/L 3.9 4.2 4.1  Chloride 96 - 106 mmol/L 100 104 100  CO2 20 - 29 mmol/L 26 23 27   Calcium 8.7 - 10.3 mg/dL 9.0 9.4 9.1  Total Protein 6.0 - 8.5 g/dL - 7.3 6.9  Total Bilirubin 0.0 - 1.2 mg/dL - 0.6 0.3  Alkaline Phos 39 - 117 IU/L - 90 74  AST 0 - 40 IU/L - 21 21  ALT 0 - 32 IU/L - 9 9    Lipid Panel     Component Value Date/Time   CHOL 198 06/30/2017 1147   TRIG 124 06/30/2017 1147   HDL 51 06/30/2017 1147   CHOLHDL 3.9 06/30/2017 1147   CHOLHDL 3.4 10/30/2015 1004   VLDL 15 10/30/2015 1004   LDLCALC 122 (H) 06/30/2017 1147    CBC    Component Value Date/Time   WBC 6.3 10/30/2015 1004   RBC 4.69 10/30/2015 1004   HGB  13.6 10/30/2015 1004   HCT 39.9 10/30/2015 1004   PLT 253 10/30/2015 1004   MCV 85.1 10/30/2015 1004   MCV 85.9 03/10/2015 1216   MCH 29.0 10/30/2015 1004   MCHC 34.1 10/30/2015 1004   RDW 13.3 10/30/2015 1004   LYMPHSABS 2,709 10/30/2015 1004   MONOABS 504 10/30/2015 1004   EOSABS 126 10/30/2015 1004   BASOSABS 0 10/30/2015 1004    Lab Results  Component Value Date   HGBA1C 6.3 (H) 02/26/2009   Lab Results  Component Value Date   TSH 0.482 04/13/2018    Assessment & Plan:   1. Other specified hypothyroidism  Controlled Continue levothyroxine - Lipid panel - TSH - T4, free - Comprehensive metabolic panel  2. Other insomnia Acute insomnia with no precipitating event Discussed the role of sleep hygiene, avoiding caffeinated products We will hold off on medications as this is acute Discussed natural remedies including chamomile tea, melatonin If symptoms persist will consider trazodone  3. Reflux gastritis Controlled - omeprazole (PRILOSEC) 20 MG capsule; Take 1 capsule (20 mg total) by mouth daily.  Dispense: 30 capsule; Refill: 2   Meds ordered this encounter  Medications  . omeprazole (PRILOSEC) 20 MG capsule    Sig: Take 1 capsule (20 mg total) by mouth daily.    Dispense:  30 capsule    Refill:  2    Follow-up: Return in about 3 months (around 10/20/2018) for medical conditions.       Hoy RegisterEnobong Aslyn Cottman, MD, FAAFP. [facility] [place], [place] [phone]   07/20/2018, 9:17 AM

## 2018-07-21 ENCOUNTER — Other Ambulatory Visit: Payer: Self-pay | Admitting: Family Medicine

## 2018-07-21 DIAGNOSIS — E038 Other specified hypothyroidism: Secondary | ICD-10-CM

## 2018-07-21 LAB — LIPID PANEL
Chol/HDL Ratio: 3.4 ratio (ref 0.0–4.4)
Cholesterol, Total: 174 mg/dL (ref 100–199)
HDL: 51 mg/dL (ref >39)
LDL Calculated: 106 mg/dL — ABNORMAL HIGH (ref 0–99)
Triglycerides: 85 mg/dL (ref 0–149)
VLDL Cholesterol Cal: 17 mg/dL (ref 5–40)

## 2018-07-21 LAB — T4, FREE: Free T4: 1.63 ng/dL (ref 0.82–1.77)

## 2018-07-21 LAB — COMPREHENSIVE METABOLIC PANEL
ALT: 7 IU/L (ref 0–32)
AST: 20 IU/L (ref 0–40)
Albumin/Globulin Ratio: 1.5 (ref 1.2–2.2)
Albumin: 4.3 g/dL (ref 3.8–4.9)
Alkaline Phosphatase: 83 IU/L (ref 39–117)
BUN/Creatinine Ratio: 20 (ref 12–28)
BUN: 13 mg/dL (ref 8–27)
Bilirubin Total: 0.5 mg/dL (ref 0.0–1.2)
CO2: 26 mmol/L (ref 20–29)
Calcium: 9.6 mg/dL (ref 8.7–10.3)
Chloride: 102 mmol/L (ref 96–106)
Creatinine, Ser: 0.66 mg/dL (ref 0.57–1.00)
GFR calc Af Amer: 111 mL/min/{1.73_m2} (ref >59)
GFR calc non Af Amer: 96 mL/min/{1.73_m2} (ref >59)
Globulin, Total: 2.9 g/dL (ref 1.5–4.5)
Glucose: 102 mg/dL — ABNORMAL HIGH (ref 65–99)
Potassium: 4.6 mmol/L (ref 3.5–5.2)
Sodium: 139 mmol/L (ref 134–144)
Total Protein: 7.2 g/dL (ref 6.0–8.5)

## 2018-07-21 LAB — TSH: TSH: 1.3 u[IU]/mL (ref 0.450–4.500)

## 2018-07-21 MED ORDER — LEVOTHYROXINE SODIUM 88 MCG PO TABS: ORAL_TABLET | ORAL | 1 refills | Status: DC

## 2018-07-28 ENCOUNTER — Telehealth: Payer: Self-pay

## 2018-07-28 NOTE — Telephone Encounter (Signed)
Patient was called and a voicemail was left informing patient to return phone call for lab results. 

## 2018-07-28 NOTE — Telephone Encounter (Signed)
-----   Message from Charlott Rakes, MD sent at 07/21/2018  8:57 AM EDT ----- Labs are normal

## 2018-10-26 ENCOUNTER — Ambulatory Visit: Payer: BLUE CROSS/BLUE SHIELD | Admitting: Family Medicine

## 2018-10-30 ENCOUNTER — Ambulatory Visit: Payer: BLUE CROSS/BLUE SHIELD | Attending: Family Medicine | Admitting: Family Medicine

## 2018-10-30 ENCOUNTER — Encounter: Payer: Self-pay | Admitting: Family Medicine

## 2018-10-30 ENCOUNTER — Other Ambulatory Visit: Payer: Self-pay

## 2018-10-30 VITALS — BP 101/63 | HR 60 | Temp 98.5°F | Ht 63.0 in | Wt 131.4 lb

## 2018-10-30 DIAGNOSIS — E038 Other specified hypothyroidism: Secondary | ICD-10-CM | POA: Diagnosis not present

## 2018-10-30 DIAGNOSIS — R7303 Prediabetes: Secondary | ICD-10-CM

## 2018-10-30 DIAGNOSIS — G4709 Other insomnia: Secondary | ICD-10-CM | POA: Diagnosis not present

## 2018-10-30 DIAGNOSIS — R399 Unspecified symptoms and signs involving the genitourinary system: Secondary | ICD-10-CM

## 2018-10-30 DIAGNOSIS — K5909 Other constipation: Secondary | ICD-10-CM

## 2018-10-30 DIAGNOSIS — H04203 Unspecified epiphora, bilateral lacrimal glands: Secondary | ICD-10-CM

## 2018-10-30 DIAGNOSIS — Z23 Encounter for immunization: Secondary | ICD-10-CM | POA: Diagnosis not present

## 2018-10-30 LAB — POCT URINALYSIS DIP (CLINITEK)
Bilirubin, UA: NEGATIVE
Glucose, UA: NEGATIVE mg/dL
Ketones, POC UA: NEGATIVE mg/dL
Nitrite, UA: NEGATIVE
POC PROTEIN,UA: NEGATIVE
Spec Grav, UA: >=1.03 — AB (ref 1.010–1.025)
Urobilinogen, UA: 0.2 E.U./dL
pH, UA: 5.5 (ref 5.0–8.0)

## 2018-10-30 MED ORDER — NITROFURANTOIN MONOHYD MACRO 100 MG PO CAPS: 100.0000 mg | ORAL_CAPSULE | Freq: Two times a day (BID) | ORAL | 0 refills | Status: DC

## 2018-10-30 MED ORDER — TRAZODONE HCL 50 MG PO TABS: 25.0000 mg | ORAL_TABLET | Freq: Every evening | ORAL | 3 refills | Status: DC | PRN

## 2018-10-30 MED ORDER — POLYETHYLENE GLYCOL 3350 17 GM/SCOOP PO POWD: 17.0000 g | Freq: Every day | ORAL | 1 refills | Status: DC

## 2018-10-30 NOTE — Progress Notes (Signed)
Patient states that she has been feeling very tired for 3 weeks now.  Patient is not sleeping and is having pain in right eye.

## 2018-10-30 NOTE — Progress Notes (Signed)
Established Patient Office Visit  Subjective:  Patient ID: Audrey Schwartz, female    DOB: [birth year]  Age: 58 y.o. MRN: [id number]  CC:  Chief Complaint  Patient presents with  . Hypothyroidism    HPI Audrey Schwartz is a 58 year old female with a history of hypothyroidism, GERD, and bells palsy who presents today for a follow up visit.  She complains insomnia that has persisted since her last visit in June.  She denies caffeine intake but does occasionally take daytime naps because she is so tired.  She has difficulty falling asleep and wakes up tired.  She does not know if she snores.   Last thyroid panel was normal and she endorses compliance with levothyroxine. She does confirm heat intolerance.  She denies chest pain, palpitations, and dyspnea. Her reflux is controlled on her PPI.  She is having constipation, with bowel movements every 2-3 days that are formed and hard.  She has not taken anything to help with this.  Complains today of burning and frequency with urination for the past few weeks.  She is trying to drink lots of water but they symptoms have not resolved.    Patient complains of bilateral watery eyes and occasional edema that has not resolved since previous diagnosis of Bell's Palsy in 10/2017.  Denies vision changes or pain.    Past Medical History:  Diagnosis Date  . Bruises easily for a long time   saw doctor, no problem found  . Complication of anesthesia 1997   slow to awaken after c section due to dentures accidentally left in  . Headache(784.0)    and nausea occasionally  . Hypothyroidism   . No pertinent past medical history    PT IS A POOR HISTORIAN   . Thyroid disease     Past Surgical History:  Procedure Laterality Date  . CESAREAN SECTION  1997  . COLONOSCOPY WITH PROPOFOL N/A 08/07/2013   Procedure: COLONOSCOPY WITH PROPOFOL;  Surgeon: Charolett Bumpers, MD;  Location: [facility];  Service: Endoscopy;  Laterality: N/A;    History  reviewed. No pertinent family history.  Social History   Socioeconomic History  . Marital status: Single    Spouse name: Not on file  . Number of children: Not on file  . Years of education: Not on file  . Highest education level: Not on file  Occupational History  . Not on file  Social Needs  . Financial resource strain: Not on file  . Food insecurity    Worry: Not on file    Inability: Not on file  . Transportation needs    Medical: Not on file    Non-medical: Not on file  Tobacco Use  . Smoking status: Never Smoker  . Smokeless tobacco: Never Used  Substance and Sexual Activity  . Alcohol use: No  . Drug use: No  . Sexual activity: Never    Birth control/protection: Abstinence  Lifestyle  . Physical activity    Days per week: Not on file    Minutes per session: Not on file  . Stress: Not on file  Relationships  . Social Musician on phone: Not on file    Gets together: Not on file    Attends religious service: Not on file    Active member of club or organization: Not on file    Attends meetings of clubs or organizations: Not on file    Relationship status: Not on file  . Intimate partner violence  Fear of current or ex partner: Not on file    Emotionally abused: Not on file    Physically abused: Not on file    Forced sexual activity: Not on file  Other Topics Concern  . Not on file  Social History Narrative  . Not on file    Outpatient Medications Prior to Visit  Medication Sig Dispense Refill  . acetaminophen (TYLENOL) 325 MG tablet Take 650 mg by mouth every 6 (six) hours as needed.    . cetirizine (ZYRTEC) 10 MG tablet Take 1 tablet (10 mg total) by mouth daily. 30 tablet 2  . levothyroxine (SYNTHROID) 88 MCG tablet TAKE 1 TABLET BY MOUTH EVERY DAY BEFORE BREAKFAST 90 tablet 1  . NON FORMULARY tetraciclina-and penicilina topical    . omeprazole (PRILOSEC) 20 MG capsule Take 1 capsule (20 mg total) by mouth daily. 30 capsule 2  .  valACYclovir (VALTREX) 1000 MG tablet Take 1 tablet (1,000 mg total) by mouth 3 (three) times daily. 21 tablet 0   No facility-administered medications prior to visit.     Allergies  Allergen Reactions  . Amoxicillin Nausea And Vomiting    Has patient had a PCN reaction causing immediate rash, facial/tongue/throat swelling, SOB or lightheadedness with hypotension: yes Has patient had a PCN reaction causing severe rash involving mucus membranes or skin necrosis: no Has patient had a PCN reaction that required hospitalization : no Has patient had a PCN reaction occurring within the last 10 years: yes If all of the above answers are "NO", then may proceed with Cephalosporin use.   Marland Kitchen Penicillins Nausea And Vomiting    Has patient had a PCN reaction causing immediate rash, facial/tongue/throat swelling, SOB or lightheadedness with hypotension: no Has patient had a PCN reaction causing severe rash involving mucus membranes or skin necrosis: no Has patient had a PCN reaction that required hospitalization: no Has patient had a PCN reaction occurring within the last 10 years: no If all of the above answers are "NO", then may proceed with Cephalosporin use.     ROS Review of Systems  Constitutional: Positive for fatigue. Negative for fever and unexpected weight change.  HENT: Negative for congestion, rhinorrhea, sinus pressure and sinus pain.   Eyes: Positive for discharge. Negative for visual disturbance.       Watery discharge  Respiratory: Negative for cough, chest tightness and shortness of breath.   Cardiovascular: Negative for chest pain, palpitations and leg swelling.  Gastrointestinal: Positive for constipation. Negative for abdominal distention, abdominal pain, diarrhea, nausea and vomiting.  Endocrine: Positive for heat intolerance. Negative for polydipsia and polyuria.  Genitourinary: Positive for dysuria and frequency. Negative for difficulty urinating and flank pain.   Musculoskeletal: Negative for arthralgias and myalgias.  Skin: Negative for color change and rash.  Neurological: Negative for dizziness, tremors, weakness and numbness.  Hematological: Does not bruise/bleed easily.  Psychiatric/Behavioral: Negative for agitation and behavioral problems.      Objective:    Physical Exam  Constitutional: She is oriented to person, place, and time. She appears well-developed and well-nourished. No distress.  HENT:  Head: Normocephalic.  Mouth/Throat: Oropharynx is clear and moist.  Eyes: Pupils are equal, round, and reactive to light. EOM are normal. Right eye exhibits discharge. Left eye exhibits discharge.  Neck: Normal range of motion. Neck supple. No thyromegaly present.  Cardiovascular: Normal rate, regular rhythm, normal heart sounds and intact distal pulses.  No murmur heard. Pulmonary/Chest: Effort normal and breath sounds normal. No respiratory distress.  Abdominal: Soft. Bowel sounds are normal. There is no abdominal tenderness. There is no guarding.  Musculoskeletal: Normal range of motion.        General: No edema.  Neurological: She is alert and oriented to person, place, and time.  Skin: Skin is warm and dry. No rash noted.    BP 101/63   Pulse 60   Temp 98.5 F (36.9 C) (Oral)   Ht 5\' 3"  (1.6 m)   Wt 131 lb 6.4 oz (59.6 kg)   LMP 02/01/2009 (Approximate)   SpO2 97%   BMI 23.28 kg/m  Wt Readings from Last 3 Encounters:  10/30/18 131 lb 6.4 oz (59.6 kg)  07/20/18 130 lb 6.4 oz (59.1 kg)  04/13/18 135 lb 9.6 oz (61.5 kg)     Health Maintenance Due  Topic Date Due  . INFLUENZA VACCINE  09/02/2018    There are no preventive care reminders to display for this patient.  Lab Results  Component Value Date   TSH 1.300 07/20/2018   Lab Results  Component Value Date   WBC 6.3 10/30/2015   HGB 13.6 10/30/2015   HCT 39.9 10/30/2015   MCV 85.1 10/30/2015   PLT 253 10/30/2015   Lab Results  Component Value Date   NA 139  07/20/2018   K 4.6 07/20/2018   CO2 26 07/20/2018   GLUCOSE 102 (H) 07/20/2018   BUN 13 07/20/2018   CREATININE 0.66 07/20/2018   BILITOT 0.5 07/20/2018   ALKPHOS 83 07/20/2018   AST 20 07/20/2018   ALT 7 07/20/2018   PROT 7.2 07/20/2018   ALBUMIN 4.3 07/20/2018   CALCIUM 9.6 07/20/2018   Lab Results  Component Value Date   CHOL 174 07/20/2018   Lab Results  Component Value Date   HDL 51 07/20/2018   Lab Results  Component Value Date   LDLCALC 106 (H) 07/20/2018   Lab Results  Component Value Date   TRIG 85 07/20/2018   Lab Results  Component Value Date   CHOLHDL 3.4 07/20/2018   Lab Results  Component Value Date   HGBA1C 6.3 (H) 02/26/2009      Assessment & Plan:   1. Other insomnia Insomnia that has persisted for 3 months Discussed the role of sleep hygiene, avoiding caffeinated products Continue natural remedies such as chamomile tea if desired Initiate low dose trazodone at bedtime PRN - traZODone (DESYREL) 50 MG tablet; Take 0.5-1 tablets (25-50 mg total) by mouth at bedtime as needed for sleep.  Dispense: 30 tablet; Refill: 3  2. Acute cystitis Acute UTI Begin Keflex  - POCT URINALYSIS DIP (CLINITEK)  3. Other constipation New diagnosis Educated on eating a diet with increased fiber, increased activity and staying hydrated. Begin Miralax 17g PO daily. - polyethylene glycol powder (GLYCOLAX/MIRALAX) 17 GM/SCOOP powder; Take 17 g by mouth daily.  Dispense: 3350 g; Refill: 1  4. Other specified hypothyroidism Controlled Check labs today Continue levothyroxine - T4, free - TSH - Basic Metabolic Panel  5. Lacrimation, bilateral Chronic Recommend follow up with opthalmology   6. Prediabetes New diagnosis Educated on avoiding carbohydrates and increasing activity. - Hemoglobin A1c   Meds ordered this encounter  Medications  . polyethylene glycol powder (GLYCOLAX/MIRALAX) 17 GM/SCOOP powder    Sig: Take 17 g by mouth daily.    Dispense:   3350 g    Refill:  1  . traZODone (DESYREL) 50 MG tablet    Sig: Take 0.5-1 tablets (25-50 mg total) by mouth at bedtime as  needed for sleep.    Dispense:  30 tablet    Refill:  3    Follow-up: Return in about 6 months (around 04/29/2019) for medical conditions.    Janann AugustPamela M Anamari Galeas, RN

## 2018-10-30 NOTE — Patient Instructions (Signed)
Insomnio Insomnia El insomnio es un trastorno del sueo que causa dificultades para conciliar el sueo o para [place]. Puede producir fatiga, falta de energa, dificultad para concentrarse, cambios en el estado de nimo y mal rendimiento escolar o laboral. Hay tres formas diferentes de clasificar el insomnio:  Dificultad para conciliar el sueo.  Dificultad para mantener el sueo.  Despertar muy precoz por la maana. Cualquier tipo de insomnio puede ser a Barrister's clerk (crnico) o a Control and instrumentation engineer (agudo). Ambos son frecuentes. Generalmente, el insomnio a corto plazo dura tres meses o menos tiempo. El crnico ocurre al menos tres veces por semana durante ms de tres meses. Cules son las causas? El insomnio puede deberse a otra afeccin, situacin o sustancia, por ejemplo:  Ansiedad.  Ciertos medicamentos.  Enfermedad de reflujo gastroesofgico (ERGE) u otras enfermedades gastrointestinales.  Asma y otras enfermedades respiratorias.  Sndrome de las piernas inquietas, apnea del sueo u otros trastornos del sueo.  Dolor crnico.  Menopausia.  Accidente cerebrovascular.  Consumo excesivo de alcohol, tabaco u drogas ilegales.  Afecciones de salud mental, como depresin.  Cafena.  Trastornos neurolgicos, como enfermedad de Alzheimer.  Hiperactividad de la glndula tiroidea (hipertiroidismo). En ocasiones, la causa del insomnio puede ser desconocida. Qu incrementa el riesgo? Los factores de riesgo de tener insomnio incluyen lo siguiente:  Sexo. La enfermedad afecta ms a menudo a las mujeres que a los hombres.  Edad. El insomnio es ms frecuente a medida que una persona envejece.  Estrs.  La falta de actividad fsica.  Los horarios de trabajo irregulares o los turnos nocturnos.  Los viajes a lugares de diferentes zonas horarias.  Ciertas afecciones mdicas y de salud mental. Cules son los signos o los sntomas? Si tiene insomnio, el sntoma principal es la  dificultad para conciliar el sueo o mantenerlo. Esto puede derivar en otros sntomas, por ejemplo:  Sentirse fatigado o tener poca energa.  Ponerse nervioso por [employer] irse a dormir.  No sentirse descansado por la maana.  Tener dificultad para concentrarse.  Sentirse irritable, ansioso o deprimido. Cmo se diagnostica? Esta afeccin se puede diagnosticar en funcin de lo siguiente:  Los sntomas y antecedentes mdicos. El mdico puede hacerle preguntas sobre: ? Hbitos de sueo. ? Cualquier afeccin mdica que tenga. ? La salud mental.  Un examen fsico. Cmo se trata? El tratamiento para el insomnio depende de la causa. El tratamiento puede centrarse en tratar Ardelia Mems afeccin preexistente que causa el insomnio. El tratamiento tambin puede incluir lo siguiente:  Medicamentos que lo ayuden a dormir.  Asesoramiento psicolgico o terapia.  Ajustes en el estilo de vida para ayudarlo a dormir mejor. Siga estas indicaciones en su casa: Comida y bebida   Limite o evite el consumo de alcohol, bebidas con cafena y cigarrillos, especialmente cerca de la hora de [place], ya que pueden perturbarle el sueo.  No consuma una comida suculenta ni coma alimentos condimentados justo antes de la hora de [place]. Esto puede causarle molestias digestivas y dificultades para dormir. Hbitos de sueo   Lleve un registro del sueo ya que podra ser de utilidad para que usted y a su mdico puedan determinar qu podra estar causndole insomnio. Escriba los siguientes datos: ? Cundo duerme. ? Cundo se despierta durante la noche. ? Qu tan bien duerme. ? Qu tan relajado se siente al da siguiente. ? Cualquier efecto secundario de los [employer] toma. ? Lo que usted come y bebe.  Convierta su habitacin en un lugar oscuro, cmodo donde sea fcil  conciliar el sueo. ? Coloque persianas o cortinas oscuras que impidan la entrada de la luz del exterior. ? Para bloquear los ruidos,  use un aparato que reproduzca sonidos ambientales o relajantes de fondo. ? Mantenga baja la temperatura.  Limite el uso de pantallas antes de la hora de [place]. Esto incluye lo siguiente: ? Mirar televisin. ? Usar el telfono inteligente, la tableta o la computadora.  Siga una rutina que incluya ir a dormir y Clinical cytogeneticist a la misma [place] y noche. Esto puede ayudarlo a conciliar el sueo ms rpidamente. Considere realizar [employer] tranquila, como leer, e incorporarla como parte de la rutina a la hora de irse a dormir.  Trate de evitar tomar siestas durante el da para que pueda dormir mejor por la noche.  Levntese de la cama si sigue despierto despus de 56minutos de haber intentado dormirse. [place] luces, pero intente leer o hacer una actividad tranquila. Cuando tenga sueo, regrese a Futures trader. Instrucciones generales  [employer] de venta libre y los recetados solamente como se lo haya indicado el mdico.  Realice ejercicio con regularidad como se lo haya indicado el mdico. Evite la actividad fsica desde varias horas antes de irse a dormir.  Utilice tcnicas de relajacin para controlar el estrs. Pdale al mdico que le sugiera algunas tcnicas que sean adecuadas para usted. Estos pueden incluir lo siguiente: ? Ejercicios de respiracin. ? Rutinas para aliviar la tensin muscular. ? Visualizacin de escenas apacibles.  Conduzca con cuidado. No conduzca si est muy somnoliento.  Concurra a todas las visitas de control como se lo haya indicado el mdico. Esto es importante. Comunquese con un mdico si:  Est cansado durante todo Games developer.  Tiene dificultad en su rutina diaria debido a la somnolencia.  Sigue teniendo problemas para dormir o Press photographer. Solicite ayuda de inmediato si:  Piensa seriamente en lastimarse a usted mismo o a Nurse, children's. Si alguna vez siente que puede lastimarse a usted mismo o a Producer, television/film/video, o tiene  pensamientos de poner fin a su vida, busque ayuda de inmediato. Puede dirigirse al servicio de emergencias ms cercano o comunicarse con:  El servicio de emergencias de su localidad (911 en [place]  Una lnea de asistencia al suicida y Freight forwarder en crisis, como la [employer] de Prevencin del Suicidio (National Suicide Prevention Lifeline), al [phone]. Est disponible las 24 horas del da. Resumen  El insomnio es un trastorno del sueo que causa dificultades para conciliar el sueo o para [place].  El insomnio puede ser a Barrister's clerk (crnico) o a Control and instrumentation engineer (agudo).  El tratamiento para el insomnio depende de la causa. El tratamiento puede centrarse en tratar Ardelia Mems afeccin preexistente que causa el insomnio.  Lleve un registro del sueo ya que podra ser de utilidad para que usted y a su mdico puedan determinar qu podra estar causndole insomnio. Esta informacin no tiene Marine scientist el consejo del mdico. Asegrese de hacerle al mdico cualquier pregunta que tenga. Document Released: 01/18/2005 Document Revised: 04/30/2017 Document Reviewed: 01/07/2017 Elsevier Patient Education  2020 [employer].

## 2018-10-31 ENCOUNTER — Other Ambulatory Visit: Payer: Self-pay | Admitting: Family Medicine

## 2018-10-31 DIAGNOSIS — E038 Other specified hypothyroidism: Secondary | ICD-10-CM

## 2018-10-31 LAB — BASIC METABOLIC PANEL
BUN/Creatinine Ratio: 17 (ref 12–28)
BUN: 16 mg/dL (ref 8–27)
CO2: 24 mmol/L (ref 20–29)
Calcium: 9.2 mg/dL (ref 8.7–10.3)
Chloride: 105 mmol/L (ref 96–106)
Creatinine, Ser: 0.92 mg/dL (ref 0.57–1.00)
GFR calc Af Amer: 78 mL/min/{1.73_m2} (ref >59)
GFR calc non Af Amer: 68 mL/min/{1.73_m2} (ref >59)
Glucose: 98 mg/dL (ref 65–99)
Potassium: 4 mmol/L (ref 3.5–5.2)
Sodium: 142 mmol/L (ref 134–144)

## 2018-10-31 LAB — T4, FREE: Free T4: 1.86 ng/dL — ABNORMAL HIGH (ref 0.82–1.77)

## 2018-10-31 LAB — HEMOGLOBIN A1C
Est. average glucose Bld gHb Est-mCnc: 114 mg/dL
Hgb A1c MFr Bld: 5.6 % (ref 4.8–5.6)

## 2018-10-31 LAB — TSH: TSH: 1.19 u[IU]/mL (ref 0.450–4.500)

## 2018-10-31 MED ORDER — LEVOTHYROXINE SODIUM 88 MCG PO TABS: ORAL_TABLET | ORAL | 1 refills | Status: DC

## 2018-11-03 ENCOUNTER — Telehealth: Payer: Self-pay

## 2018-11-03 NOTE — Telephone Encounter (Signed)
Patient was called and a voicemail was left informing patient to return phone call for lab results. 

## 2018-11-03 NOTE — Telephone Encounter (Signed)
-----   Message from Enobong Newlin, MD sent at 10/31/2018  1:33 PM EDT ----- Labs are stable 

## 2018-11-09 ENCOUNTER — Telehealth: Payer: Self-pay

## 2018-11-09 NOTE — Telephone Encounter (Signed)
Patient name and DOB has been verified Patient was informed of lab results. Patient had no questions.  

## 2018-11-09 NOTE — Telephone Encounter (Signed)
-----   Message from Charlott Rakes, MD sent at 10/31/2018  1:33 PM EDT ----- Labs are stable

## 2019-03-27 ENCOUNTER — Other Ambulatory Visit: Payer: Self-pay | Admitting: Family Medicine

## 2019-03-27 DIAGNOSIS — E038 Other specified hypothyroidism: Secondary | ICD-10-CM

## 2019-05-17 ENCOUNTER — Ambulatory Visit: Payer: 59 | Attending: Family Medicine | Admitting: Family Medicine

## 2019-05-17 ENCOUNTER — Encounter: Payer: Self-pay | Admitting: Family Medicine

## 2019-05-17 ENCOUNTER — Other Ambulatory Visit: Payer: Self-pay

## 2019-05-17 DIAGNOSIS — E038 Other specified hypothyroidism: Secondary | ICD-10-CM

## 2019-05-17 DIAGNOSIS — Z1231 Encounter for screening mammogram for malignant neoplasm of breast: Secondary | ICD-10-CM

## 2019-05-17 MED ORDER — LEVOTHYROXINE SODIUM 88 MCG PO TABS: ORAL_TABLET | ORAL | 0 refills | Status: DC

## 2019-05-17 NOTE — Progress Notes (Signed)
Virtual Visit via Telephone Note  I connected with Audrey Schwartz, on 05/17/2019 at 9:42 AM by telephone due to the COVID-19 pandemic and verified that I am speaking with the correct person using two identifiers.   Consent: I discussed the limitations, risks, security and privacy concerns of performing an evaluation and management service by telephone and the availability of in person appointments. I also discussed with the patient that there may be a patient responsible charge related to this service. The patient expressed understanding and agreed to proceed.   Location of Patient: Home  Location of Provider: Clinic   Persons participating in Telemedicine visit: Io Dieujuste JA#[id number] Dr. Margarita Rana     History of Present Illness: Audrey Schwartz is a 59 year old female with a history of hypothyroidism, GERD, insomnia here for a follow-up visit.   She is doing well on Trazodone for insomnia which she takes as needed and does not need refills today . Tolerating her Levothyroxine with no complains of adverse side effects She has no additional complaints today. Last TSH from 10/2018 was normal   Past Medical History:  Diagnosis Date  . Bruises easily for a long time   saw doctor, no problem found  . Complication of anesthesia 1997   slow to awaken after c section due to dentures accidentally left in  . Headache(784.0)    and nausea occasionally  . Hypothyroidism   . No pertinent past medical history    PT IS A POOR HISTORIAN   . Thyroid disease    Allergies  Allergen Reactions  . Amoxicillin Nausea And Vomiting    Has patient had a PCN reaction causing immediate rash, facial/tongue/throat swelling, SOB or lightheadedness with hypotension: yes Has patient had a PCN reaction causing severe rash involving mucus membranes or skin necrosis: no Has patient had a PCN reaction that required hospitalization : no Has patient had a PCN  reaction occurring within the last 10 years: yes If all of the above answers are "NO", then may proceed with Cephalosporin use.   Marland Kitchen Penicillins Nausea And Vomiting    Has patient had a PCN reaction causing immediate rash, facial/tongue/throat swelling, SOB or lightheadedness with hypotension: no Has patient had a PCN reaction causing severe rash involving mucus membranes or skin necrosis: no Has patient had a PCN reaction that required hospitalization: no Has patient had a PCN reaction occurring within the last 10 years: no If all of the above answers are "NO", then may proceed with Cephalosporin use.     Current Outpatient Medications on File Prior to Visit  Medication Sig Dispense Refill  . levothyroxine (SYNTHROID) 88 MCG tablet TAKE ONE TABLET BY MOUTH ONE TIME DAILY before breakfast. Must have office visit for refills 30 tablet 0  . valACYclovir (VALTREX) 1000 MG tablet Take 1 tablet (1,000 mg total) by mouth 3 (three) times daily. 21 tablet 0  . acetaminophen (TYLENOL) 325 MG tablet Take 650 mg by mouth every 6 (six) hours as needed.    . cetirizine (ZYRTEC) 10 MG tablet Take 1 tablet (10 mg total) by mouth daily. (Patient not taking: Reported on 05/17/2019) 30 tablet 2  . nitrofurantoin, macrocrystal-monohydrate, (MACROBID) 100 MG capsule Take 1 capsule (100 mg total) by mouth 2 (two) times daily. (Patient not taking: Reported on 05/17/2019) 14 capsule 0  . NON FORMULARY tetraciclina-and penicilina topical    . omeprazole (PRILOSEC) 20 MG capsule Take 1 capsule (20 mg total) by mouth daily. (Patient not taking: Reported on 05/17/2019)  30 capsule 2  . polyethylene glycol powder (GLYCOLAX/MIRALAX) 17 GM/SCOOP powder Take 17 g by mouth daily. (Patient not taking: Reported on 05/17/2019) 3350 g 1  . traZODone (DESYREL) 50 MG tablet Take 0.5-1 tablets (25-50 mg total) by mouth at bedtime as needed for sleep. (Patient not taking: Reported on 05/17/2019) 30 tablet 3   No current  facility-administered medications on file prior to visit.    Observations/Objective: Awake, alert, oriented x3 Not in acute distress  Lab Results  Component Value Date   TSH 1.190 10/30/2018    Assessment and Plan: 1. Other specified hypothyroidism Controlled - Lipid panel - T4, free; Future - TSH; Future - levothyroxine (SYNTHROID) 88 MCG tablet; TAKE ONE TABLET BY MOUTH ONE TIME DAILY before breakfast.  Dispense: 30 tablet; Refill: 0 - CMP14+EGFR  2. Encounter for screening mammogram for malignant neoplasm of breast Patient encouraged to schedule appointment: - MM 3D SCREEN BREAST BILATERAL; Future   Follow Up Instructions: Return in about 1 week (around 05/24/2019) for Fasting lab; chronic disease management in 6 months.    I discussed the assessment and treatment plan with the patient. The patient was provided an opportunity to ask questions and all were answered. The patient agreed with the plan and demonstrated an understanding of the instructions.   The patient was advised to call back or seek an in-person evaluation if the symptoms worsen or if the condition fails to improve as anticipated.     I provided 11 minutes total of non-face-to-face time during this encounter including median intraservice time, reviewing previous notes, investigations, ordering medications, medical decision making, coordinating care and patient verbalized understanding at the end of the visit.     Charlott Rakes, MD, FAAFP. [facility] and [place], [place]   05/17/2019, 9:42 AM

## 2019-05-24 ENCOUNTER — Ambulatory Visit: Payer: Self-pay | Attending: Family Medicine

## 2019-05-24 ENCOUNTER — Other Ambulatory Visit: Payer: Self-pay

## 2019-05-24 DIAGNOSIS — E038 Other specified hypothyroidism: Secondary | ICD-10-CM

## 2019-05-25 ENCOUNTER — Other Ambulatory Visit: Payer: Self-pay | Admitting: Family Medicine

## 2019-05-25 DIAGNOSIS — E038 Other specified hypothyroidism: Secondary | ICD-10-CM

## 2019-05-25 LAB — T4, FREE: Free T4: 1.55 ng/dL (ref 0.82–1.77)

## 2019-05-25 LAB — TSH: TSH: 1.3 u[IU]/mL (ref 0.450–4.500)

## 2019-05-25 MED ORDER — LEVOTHYROXINE SODIUM 88 MCG PO TABS: ORAL_TABLET | ORAL | 6 refills | Status: DC

## 2019-06-01 ENCOUNTER — Telehealth: Payer: Self-pay

## 2019-06-01 NOTE — Telephone Encounter (Signed)
-----   Message from Hoy Register, MD sent at 05/25/2019  1:24 PM EDT ----- Thyroid Labs are normal

## 2019-06-01 NOTE — Telephone Encounter (Signed)
Patient was called and a voicemail was left informing patient to return phone call for lab results. 

## 2019-06-07 ENCOUNTER — Telehealth: Payer: Self-pay

## 2019-06-07 NOTE — Telephone Encounter (Signed)
Patient name and DOB has been verified Patient was informed of lab results. Patient had no questions.  

## 2019-06-07 NOTE — Telephone Encounter (Signed)
-----   Message from Enobong Newlin, MD sent at 05/25/2019  1:24 PM EDT ----- Thyroid Labs are normal 

## 2019-09-20 ENCOUNTER — Telehealth: Payer: Self-pay | Admitting: Family Medicine

## 2019-09-20 DIAGNOSIS — E038 Other specified hypothyroidism: Secondary | ICD-10-CM

## 2019-09-20 MED ORDER — LEVOTHYROXINE SODIUM 88 MCG PO TABS: ORAL_TABLET | ORAL | 0 refills | Status: DC

## 2019-09-20 NOTE — Telephone Encounter (Signed)
Pt called to request a refill for levothyroxine (SYNTHROID) 88 MCG tablet Please send it to [id number] - [place], [place] - [address]  [address], [place] [address]  Please follow up

## 2019-09-20 NOTE — Telephone Encounter (Signed)
Patient needs appointment. Refill sent for 1 month.

## 2019-10-10 ENCOUNTER — Telehealth: Payer: Self-pay | Admitting: Family Medicine

## 2019-10-10 NOTE — Telephone Encounter (Signed)
Please advise.   Copied from CRM [id number]. Topic: General - Other >> Oct 10, 2019 11:38 AM Elliot Gault wrote: Reason for CRM: patient would like her thyroid check requesting lab orders,. Patient scheduled next available with any provider for 11/07/2019 due to PCP being booked so far out. Please return call  [phone]

## 2019-10-10 NOTE — Telephone Encounter (Signed)
Has an appointment on 11/07/19 with Marylene Land first available appointment

## 2019-10-11 NOTE — Telephone Encounter (Signed)
Noted  

## 2019-10-11 NOTE — Telephone Encounter (Signed)
She had a normal TSH in 05/2019 when I checked. No indication for an urgent TSH check. She can wait for her appointment

## 2019-11-07 ENCOUNTER — Ambulatory Visit (HOSPITAL_BASED_OUTPATIENT_CLINIC_OR_DEPARTMENT_OTHER): Payer: 59 | Admitting: Pharmacist

## 2019-11-07 ENCOUNTER — Other Ambulatory Visit: Payer: Self-pay

## 2019-11-07 ENCOUNTER — Encounter: Payer: Self-pay | Admitting: Pharmacist

## 2019-11-07 ENCOUNTER — Ambulatory Visit: Payer: 59 | Attending: Physician Assistant | Admitting: Physician Assistant

## 2019-11-07 ENCOUNTER — Encounter: Payer: Self-pay | Admitting: Physician Assistant

## 2019-11-07 DIAGNOSIS — E038 Other specified hypothyroidism: Secondary | ICD-10-CM

## 2019-11-07 DIAGNOSIS — Z23 Encounter for immunization: Secondary | ICD-10-CM

## 2019-11-07 MED ORDER — LEVOTHYROXINE SODIUM 88 MCG PO TABS: ORAL_TABLET | ORAL | 3 refills | Status: DC

## 2019-11-07 NOTE — Progress Notes (Signed)
Virtual Visit via Telephone Note  I connected with Audrey Schwartz on 11/07/19 at  1:30 PM EDT by telephone and verified that I am speaking with the correct person using two identifiers.   I discussed the limitations, risks, security and privacy concerns of performing an evaluation and management service by telephone and the availability of in person appointments. I also discussed with the patient that there may be a patient responsible charge related to this service. The patient expressed understanding and agreed to proceed.   PATIENT visit by telephone virtually in the context of Covid-19 pandemic. Patient location: home My Location:  [facility] office Persons on the call:  Josalyn(interpreter), the patient, and roomed by Carolynne Edouard, and me  History of Present Illness:  patient doing well on thyroid medications.  Last 3 labs for TSH has shown therapeutic dosing.  She denies any s/sx of underactive or overactive thyroid today.  Doing well.  No concerns or issues.  Last labs 05/2019.      Observations/Objective:  NAD.  A&Ox3  Assessment and Plan: 1. Other specified hypothyroidism Therapeutic last 3 labs.  Will check labs again at next visit in 2-3 months - levothyroxine (SYNTHROID) 88 MCG tablet; TAKE ONE TABLET BY MOUTH ONE TIME DAILY before breakfast.  Dispense: 30 tablet; Refill: 3    Follow Up Instructions: See PCP in 2-3 months   I discussed the assessment and treatment plan with the patient. The patient was provided an opportunity to ask questions and all were answered. The patient agreed with the plan and demonstrated an understanding of the instructions.   The patient was advised to call back or seek an in-person evaluation if the symptoms worsen or if the condition fails to improve as anticipated.  I provided 11 minutes of non-face-to-face time during this encounter.   Georgian Co, PA-C  Patient ID: Audrey Schwartz, female   DOB: 09-01-1960, 59 y.o.   MRN:  [id number]

## 2019-11-07 NOTE — Progress Notes (Signed)
Patient presents for vaccination against infleunza per orders of Dr. Alvis Lemmings. Consent given. Counseling provided. No contraindications exists. Vaccine administered without incident.   Butch Penny, PharmD, CPP Clinical Pharmacist [facility] [phone]

## 2019-12-18 ENCOUNTER — Other Ambulatory Visit: Payer: Self-pay

## 2019-12-18 ENCOUNTER — Ambulatory Visit
Admission: RE | Admit: 2019-12-18 | Discharge: 2019-12-18 | Disposition: A | Discharge status: Home / Self Care | Payer: 59 | Source: Ambulatory Visit | Attending: Family Medicine | Admitting: Family Medicine

## 2019-12-18 DIAGNOSIS — Z1231 Encounter for screening mammogram for malignant neoplasm of breast: Secondary | ICD-10-CM

## 2019-12-21 ENCOUNTER — Telehealth: Payer: Self-pay

## 2019-12-21 NOTE — Telephone Encounter (Signed)
Patient name and DOB has been verified Patient was informed of lab results. Patient had no questions.  

## 2019-12-21 NOTE — Telephone Encounter (Signed)
-----   Message from Hoy Register, MD sent at 12/21/2019  1:16 PM EST ----- Mammogram is negative for malignancy

## 2020-02-07 ENCOUNTER — Encounter: Payer: Self-pay | Admitting: Family Medicine

## 2020-02-07 ENCOUNTER — Ambulatory Visit: Payer: 59 | Attending: Family Medicine | Admitting: Family Medicine

## 2020-02-07 ENCOUNTER — Other Ambulatory Visit: Payer: Self-pay

## 2020-02-07 VITALS — BP 115/66 | HR 84 | Ht 63.0 in | Wt 132.0 lb

## 2020-02-07 DIAGNOSIS — Z13228 Encounter for screening for other metabolic disorders: Secondary | ICD-10-CM | POA: Diagnosis not present

## 2020-02-07 DIAGNOSIS — E038 Other specified hypothyroidism: Secondary | ICD-10-CM

## 2020-02-07 NOTE — Progress Notes (Signed)
Virtual Visit via Video Note  I connected with Audrey Schwartz, on 02/07/2020 at 9:56 AM by video enabled telemedicine device due to the COVID-19 pandemic and verified that I am speaking with the correct person using two identifiers.   Consent: I discussed the limitations, risks, security and privacy concerns of performing an evaluation and management service by telemedicine and the availability of in person appointments. I also discussed with the patient that there may be a patient responsible charge related to this service. The patient expressed understanding and agreed to proceed.   Location of Patient: Clinic  Location of Provider: Home   Persons participating in Telemedicine visit: Karel Mowers [place]-CMA Dr. Sidney Ace [phone]     History of Present Illness: Audrey Schwartz a 60 year old female with a history of hypothyroidism, GERD, insomnia herefor a follow-up visit. She would like to go over her mammogram report as she was of the opinion she had breast masses. Mammogram 12/20/19: EXAM: DIGITAL SCREENING BILATERAL MAMMOGRAM WITH TOMO AND CAD  COMPARISON:  Previous exam(s).  ACR Breast Density Category c: The breast tissue is heterogeneously dense, which may obscure small masses.  FINDINGS: There are no findings suspicious for malignancy. Images were processed with CAD.  IMPRESSION: No mammographic evidence of malignancy. A result letter of this screening mammogram will be mailed directly to the patient.  RECOMMENDATION: Screening mammogram in one year. (Code:SM-B-01Y)   Results explained to her with the assistance of the interpreter and she was reassured. Compliant with her Levothyroxine and she has no additional concerns today.     Past Medical History:  Diagnosis Date  . Bruises easily for a long time   saw doctor, no problem found  . Complication of anesthesia 1997   slow to awaken after c section due to  dentures accidentally left in  . Headache(784.0)    and nausea occasionally  . Hypothyroidism   . No pertinent past medical history    PT IS A POOR HISTORIAN   . Thyroid disease    Allergies  Allergen Reactions  . Amoxicillin Nausea And Vomiting    Has patient had a PCN reaction causing immediate rash, facial/tongue/throat swelling, SOB or lightheadedness with hypotension: yes Has patient had a PCN reaction causing severe rash involving mucus membranes or skin necrosis: no Has patient had a PCN reaction that required hospitalization : no Has patient had a PCN reaction occurring within the last 10 years: yes If all of the above answers are "NO", then may proceed with Cephalosporin use.   Marland Kitchen Penicillins Nausea And Vomiting    Has patient had a PCN reaction causing immediate rash, facial/tongue/throat swelling, SOB or lightheadedness with hypotension: no Has patient had a PCN reaction causing severe rash involving mucus membranes or skin necrosis: no Has patient had a PCN reaction that required hospitalization: no Has patient had a PCN reaction occurring within the last 10 years: no If all of the above answers are "NO", then may proceed with Cephalosporin use.     Current Outpatient Medications on File Prior to Visit  Medication Sig Dispense Refill  . acetaminophen (TYLENOL) 325 MG tablet Take 650 mg by mouth every 6 (six) hours as needed.    Marland Kitchen levothyroxine (SYNTHROID) 88 MCG tablet TAKE ONE TABLET BY MOUTH ONE TIME DAILY before breakfast. 30 tablet 3  . cetirizine (ZYRTEC) 10 MG tablet Take 1 tablet (10 mg total) by mouth daily. (Patient not taking: No sig reported) 30 tablet 2  . NON FORMULARY tetraciclina-and penicilina topical (  Patient not taking: Reported on 02/07/2020)    . polyethylene glycol powder (GLYCOLAX/MIRALAX) 17 GM/SCOOP powder Take 17 g by mouth daily. (Patient not taking: No sig reported) 3350 g 1   No current facility-administered medications on file prior to visit.     Observations/Objective: Today's Vitals   02/07/20 0937  BP: 115/66  Pulse: 84  SpO2: 99%  Weight: 132 lb (59.9 kg)  Height: 5\' 3"  (1.6 m)   Body mass index is 23.38 kg/m.  Awake, alert, oriented x3 Not in acute distress Normal mood   Lab Results  Component Value Date   TSH 1.300 05/24/2019    CMP Latest Ref Rng & Units 10/30/2018 07/20/2018 04/13/2018  Glucose 65 - 99 mg/dL 98 06/13/2018) 95  BUN 8 - 27 mg/dL 16 13 14   Creatinine 0.57 - 1.00 mg/dL 366(Q 9.47  Sodium 134 - 144 mmol/L 142 139 136  Potassium 3.5 - 5.2 mmol/L 4.0 4.6 3.9  Chloride 96 - 106 mmol/L 105 102 100  CO2 20 - 29 mmol/L 24 26 26   Calcium 8.7 - 10.3 mg/dL 9.2 9.6 9.0  Total Protein 6.0 - 8.5 g/dL - 7.2 -  Total Bilirubin 0.0 - 1.2 mg/dL - 0.5 -  Alkaline Phos 39 - 117 IU/L - 83 -  AST 0 - 40 IU/L - 20 -  ALT 0 - 32 IU/L - 7 -    Assessment and Plan: 1. Other specified hypothyroidism Will send off labs and adjust regimen accordingly - T4, free - TSH  2. Screening for metabolic disorder - Basic Metabolic Panel - Lipid panel   Follow Up Instructions: Return in about 6 months (around 08/06/2020) for Hypothyroidism.    I discussed the assessment and treatment plan with the patient. The patient was provided an opportunity to ask questions and all were answered. The patient agreed with the plan and demonstrated an understanding of the instructions.   The patient was advised to call back or seek an in-person evaluation if the symptoms worsen or if the condition fails to improve as anticipated.     I provided 16 minutes total of Telehealth time during this encounter including median intraservice time, reviewing previous notes, investigations, ordering medications, medical decision making, coordinating care and patient verbalized understanding at the end of the visit.     6.50, MD, FAAFP. [facility] [place], Hoy Register [facility]   02/07/2020,  9:56 AM

## 2020-02-08 ENCOUNTER — Other Ambulatory Visit: Payer: Self-pay | Admitting: Family Medicine

## 2020-02-08 DIAGNOSIS — E038 Other specified hypothyroidism: Secondary | ICD-10-CM

## 2020-02-08 LAB — LIPID PANEL
Chol/HDL Ratio: 3.5 ratio (ref 0.0–4.4)
Cholesterol, Total: 184 mg/dL (ref 100–199)
HDL: 53 mg/dL (ref >39)
LDL Chol Calc (NIH): 117 mg/dL — ABNORMAL HIGH (ref 0–99)
Triglycerides: 75 mg/dL (ref 0–149)
VLDL Cholesterol Cal: 14 mg/dL (ref 5–40)

## 2020-02-08 LAB — TSH: TSH: 0.515 u[IU]/mL (ref 0.450–4.500)

## 2020-02-08 LAB — BASIC METABOLIC PANEL
BUN/Creatinine Ratio: 21 (ref 12–28)
BUN: 16 mg/dL (ref 8–27)
CO2: 22 mmol/L (ref 20–29)
Calcium: 8.9 mg/dL (ref 8.7–10.3)
Chloride: 102 mmol/L (ref 96–106)
Creatinine, Ser: 0.77 mg/dL (ref 0.57–1.00)
GFR calc Af Amer: 96 mL/min/{1.73_m2} (ref >59)
GFR calc non Af Amer: 84 mL/min/{1.73_m2} (ref >59)
Glucose: 85 mg/dL (ref 65–99)
Potassium: 4.2 mmol/L (ref 3.5–5.2)
Sodium: 138 mmol/L (ref 134–144)

## 2020-02-08 LAB — T4, FREE: Free T4: 1.35 ng/dL (ref 0.82–1.77)

## 2020-02-08 MED ORDER — LEVOTHYROXINE SODIUM 88 MCG PO TABS: ORAL_TABLET | ORAL | 6 refills | Status: DC

## 2020-06-16 ENCOUNTER — Other Ambulatory Visit: Payer: Self-pay

## 2020-06-16 ENCOUNTER — Other Ambulatory Visit (HOSPITAL_COMMUNITY)
Admission: RE | Admit: 2020-06-16 | Discharge: 2020-06-16 | Disposition: A | Discharge status: Home / Self Care | Payer: 59 | Source: Ambulatory Visit | Attending: Family Medicine | Admitting: Family Medicine

## 2020-06-16 ENCOUNTER — Encounter: Payer: Self-pay | Admitting: Family Medicine

## 2020-06-16 ENCOUNTER — Ambulatory Visit: Payer: 59 | Attending: Family Medicine | Admitting: Family Medicine

## 2020-06-16 VITALS — BP 108/67 | HR 65 | Ht 63.0 in | Wt 124.0 lb

## 2020-06-16 DIAGNOSIS — E038 Other specified hypothyroidism: Secondary | ICD-10-CM

## 2020-06-16 DIAGNOSIS — Z124 Encounter for screening for malignant neoplasm of cervix: Secondary | ICD-10-CM | POA: Diagnosis present

## 2020-06-16 DIAGNOSIS — K2960 Other gastritis without bleeding: Secondary | ICD-10-CM

## 2020-06-16 DIAGNOSIS — Z0000 Encounter for general adult medical examination without abnormal findings: Secondary | ICD-10-CM | POA: Diagnosis not present

## 2020-06-16 MED ORDER — OMEPRAZOLE 40 MG PO CPDR: 40.0000 mg | DELAYED_RELEASE_CAPSULE | Freq: Every day | ORAL | 3 refills | Status: DC

## 2020-06-16 NOTE — Patient Instructions (Signed)
Mantenimiento de la salud en las mujeres Health Maintenance, Female Adoptar un estilo de vida saludable y recibir atencin preventiva son importantes para promover la salud y el bienestar. Consulte al mdico sobre:  El esquema adecuado para hacerse pruebas y exmenes peridicos.  Cosas que puede hacer por su cuenta para prevenir enfermedades y mantenerse sana. Qu debo saber sobre la dieta, el peso y el ejercicio? Consuma una dieta saludable  Consuma una dieta que incluya muchas verduras, frutas, productos lcteos con bajo contenido de grasa y protenas magras.  No consuma muchos alimentos ricos en grasas slidas, azcares agregados o sodio.   Mantenga un peso saludable El ndice de masa muscular (IMC) se utiliza para identificar problemas de peso. Proporciona una estimacin de la grasa corporal basndose en el peso y la altura. Su mdico puede ayudarle a determinar su IMC y a lograr o mantener un peso saludable. Haga ejercicio con regularidad Haga ejercicio con regularidad. Esta es una de las prcticas ms importantes que puede hacer por su salud. La mayora de los adultos deben seguir estas pautas:  Realizar, al menos, 150minutos de actividad fsica por semana. El ejercicio debe aumentar la frecuencia cardaca y hacerlo transpirar (ejercicio de intensidad moderada).  Hacer ejercicios de fortalecimiento por lo menos dos veces por semana. Agregue esto a su plan de ejercicio de intensidad moderada.  Pasar menos tiempo sentados. Incluso la actividad fsica ligera puede ser beneficiosa. Controle sus niveles de colesterol y lpidos en la sangre Comience a realizarse anlisis de lpidos y colesterol en la sangre a los 20aos y luego reptalos cada 5aos. Hgase controlar los niveles de colesterol con mayor frecuencia si:  Sus niveles de lpidos y colesterol son altos.  Es mayor de 40aos.  Presenta un alto riesgo de padecer enfermedades cardacas. Qu debo saber sobre las pruebas de  deteccin del cncer? Segn su historia clnica y sus antecedentes familiares, es posible que deba realizarse pruebas de deteccin del cncer en diferentes edades. Esto puede incluir pruebas de deteccin de lo siguiente:  Cncer de mama.  Cncer de cuello uterino.  Cncer colorrectal.  Cncer de piel.  Cncer de pulmn. Qu debo saber sobre la enfermedad cardaca, la diabetes y la hipertensin arterial? Presin arterial y enfermedad cardaca  La hipertensin arterial causa enfermedades cardacas y aumenta el riesgo de accidente cerebrovascular. Es ms probable que esto se manifieste en las personas que tienen lecturas de presin arterial alta, tienen ascendencia africana o tienen sobrepeso.  Hgase controlar la presin arterial: ? Cada 3 a 5 aos si tiene entre 18 y 39 aos. ? Todos los aos si es mayor de 40aos. Diabetes Realcese exmenes de deteccin de la diabetes con regularidad. Este anlisis revisa el nivel de azcar en la sangre en ayunas. Hgase las pruebas de deteccin:  Cada tresaos despus de los 40aos de edad si tiene un peso normal y un bajo riesgo de padecer diabetes.  Con ms frecuencia y a partir de una edad inferior si tiene sobrepeso o un alto riesgo de padecer diabetes. Qu debo saber sobre la prevencin de infecciones? Hepatitis B Si tiene un riesgo ms alto de contraer hepatitis B, debe someterse a un examen de deteccin de este virus. Hable con el mdico para averiguar si tiene riesgo de contraer la infeccin por hepatitis B. Hepatitis C Se recomienda el anlisis a:  Todos los que nacieron entre 1945 y 1965.  Todas las personas que tengan un riesgo de haber contrado hepatitis C. Enfermedades de transmisin sexual (ETS)  Hgase   las pruebas de deteccin de ITS, incluidas la gonorrea y la clamidia, si: ? Es sexualmente activa y es menor de 24aos. ? Es mayor de 24aos, y el mdico le informa que corre riesgo de tener este tipo de  infecciones. ? La actividad sexual ha cambiado desde que le hicieron la ltima prueba de deteccin y tiene un riesgo mayor de tener clamidia o gonorrea. Pregntele al mdico si usted tiene riesgo.  Pregntele al mdico si usted tiene un alto riesgo de contraer VIH. El mdico tambin puede recomendarle un medicamento recetado para ayudar a evitar la infeccin por el VIH. Si elige tomar medicamentos para prevenir el VIH, primero debe hacerse los anlisis de deteccin del VIH. Luego debe hacerse anlisis cada 3meses mientras est tomando los medicamentos. Embarazo  Si est por dejar de menstruar (fase premenopusica) y usted puede quedar embarazada, busque asesoramiento antes de quedar embarazada.  Tome de 400 a 800microgramos (mcg) de cido flico todos los das si queda embarazada.  Pida mtodos de control de la natalidad (anticonceptivos) si desea evitar un embarazo no deseado. Osteoporosis y menopausia La osteoporosis es una enfermedad en la que los huesos pierden los minerales y la fuerza por el avance de la edad. El resultado pueden ser fracturas en los huesos. Si tiene 65aos o ms, o si est en riesgo de sufrir osteoporosis y fracturas, pregunte a su mdico si debe:  Hacerse pruebas de deteccin de prdida sea.  Tomar un suplemento de calcio o de vitamina D para reducir el riesgo de fracturas.  Recibir terapia de reemplazo hormonal (TRH) para tratar los sntomas de la menopausia. Siga estas instrucciones en su casa: Estilo de vida  No consuma ningn producto que contenga nicotina o tabaco, como cigarrillos, cigarrillos electrnicos y tabaco de mascar. Si necesita ayuda para dejar de fumar, consulte al mdico.  No consuma drogas.  No comparta agujas.  Solicite ayuda a su mdico si necesita apoyo o informacin para abandonar las drogas. Consumo de alcohol  No beba alcohol si: ? Su mdico le indica no hacerlo. ? Est embarazada, puede estar embarazada o est tratando de quedar  embarazada.  Si bebe alcohol: ? Limite la cantidad que consume de 0 a 1 medida por da. ? Limite la ingesta si est amamantando.  Est atento a la cantidad de alcohol que hay en las bebidas que toma. En [place], una medida equivale a una botella de cerveza de 12oz (355ml), un vaso de vino de 5oz (148ml) o un vaso de una bebida alcohlica de alta graduacin de 1oz (44ml). Instrucciones generales  Realcese los estudios de rutina de la salud, dentales y de la vista.  Mantngase al da con las vacunas.  Infrmele a su mdico si: ? Se siente deprimida con frecuencia. ? Alguna vez ha sido vctima de maltrato o no se siente segura en su casa. Resumen  Adoptar un estilo de vida saludable y recibir atencin preventiva son importantes para promover la salud y el bienestar.  Siga las instrucciones del mdico acerca de una dieta saludable, el ejercicio y la realizacin de pruebas o exmenes para detectar enfermedades.  Siga las instrucciones del mdico con respecto al control del colesterol y la presin arterial. Esta informacin no tiene como fin reemplazar el consejo del mdico. Asegrese de hacerle al mdico cualquier pregunta que tenga. Document Revised: 02/08/2018 Document Reviewed: 02/08/2018 Elsevier Patient Education  2021 Elsevier Inc.  

## 2020-06-16 NOTE — Progress Notes (Signed)
Physical/pap

## 2020-06-16 NOTE — Progress Notes (Signed)
Subjective:  Patient ID: Audrey Schwartz, female    DOB: 12-Nov-1960  Age: 60 y.o. MRN: [id number]  CC: Annual Exam and Gynecologic Exam   HPI Audrey Schwartz is a 60 year old female with a history of hypothyroidism, GERD, insomnia hereforan annual physical exam. Last colonoscopy was in 2015 and was normal with recommendation for repeat in 10 years. Last mammogram was negative for malignancy in 12/2019. She is due for Pap smear today.  Interval History: She complains of episodes of gastritis with epigastric pain.  Previously was on a PPI in the past and is requesting refill of that. She is compliant with her levothyroxine for hypothyroidism.  Past Medical History:  Diagnosis Date  . Bruises easily for a long time   saw doctor, no problem found  . Complication of anesthesia 1997   slow to awaken after c section due to dentures accidentally left in  . Headache(784.0)    and nausea occasionally  . Hypothyroidism   . No pertinent past medical history    PT IS A POOR HISTORIAN   . Thyroid disease     Past Surgical History:  Procedure Laterality Date  . CESAREAN SECTION  1997  . COLONOSCOPY WITH PROPOFOL N/A 08/07/2013   Procedure: COLONOSCOPY WITH PROPOFOL;  Surgeon: Garlan Fair, MD;  Location: [facility];  Service: Endoscopy;  Laterality: N/A;    Family History  Problem Relation Age of Onset  . Breast cancer Neg Hx     Allergies  Allergen Reactions  . Amoxicillin Nausea And Vomiting    Has patient had a PCN reaction causing immediate rash, facial/tongue/throat swelling, SOB or lightheadedness with hypotension: yes Has patient had a PCN reaction causing severe rash involving mucus membranes or skin necrosis: no Has patient had a PCN reaction that required hospitalization : no Has patient had a PCN reaction occurring within the last 10 years: yes If all of the above answers are "NO", then may proceed with Cephalosporin use.   Marland Kitchen Penicillins Nausea And  Vomiting    Has patient had a PCN reaction causing immediate rash, facial/tongue/throat swelling, SOB or lightheadedness with hypotension: no Has patient had a PCN reaction causing severe rash involving mucus membranes or skin necrosis: no Has patient had a PCN reaction that required hospitalization: no Has patient had a PCN reaction occurring within the last 10 years: no If all of the above answers are "NO", then may proceed with Cephalosporin use.     Outpatient Medications Prior to Visit  Medication Sig Dispense Refill  . acetaminophen (TYLENOL) 325 MG tablet Take 650 mg by mouth every 6 (six) hours as needed.    Marland Kitchen levothyroxine (SYNTHROID) 88 MCG tablet TAKE ONE TABLET BY MOUTH ONE TIME DAILY before breakfast. 30 tablet 6  . cetirizine (ZYRTEC) 10 MG tablet Take 1 tablet (10 mg total) by mouth daily. (Patient not taking: No sig reported) 30 tablet 2  . NON FORMULARY tetraciclina-and penicilina topical (Patient not taking: No sig reported)    . polyethylene glycol powder (GLYCOLAX/MIRALAX) 17 GM/SCOOP powder Take 17 g by mouth daily. (Patient not taking: No sig reported) 3350 g 1   No facility-administered medications prior to visit.     ROS Review of Systems  Constitutional: Negative for activity change, appetite change and fatigue.  HENT: Negative for congestion, sinus pressure and sore throat.   Eyes: Negative for visual disturbance.  Respiratory: Negative for cough, chest tightness, shortness of breath and wheezing.   Cardiovascular: Negative for chest pain and palpitations.  Gastrointestinal: Negative for abdominal distention, abdominal pain and constipation.  Endocrine: Negative for polydipsia.  Genitourinary: Negative for dysuria and frequency.  Musculoskeletal: Negative for arthralgias and back pain.  Skin: Negative for rash.  Neurological: Negative for tremors, light-headedness and numbness.  Hematological: Does not bruise/bleed easily.  Psychiatric/Behavioral: Negative  for agitation and behavioral problems.    Objective:  BP 108/67   Pulse 65   Ht _0  (1.6 m)   Wt 124 lb (56.2 kg)   LMP 02/01/2009 (Approximate)   SpO2 99%   BMI 21.97 kg/m   BP/Weight 06/16/2020 02/07/2020 5/95/6387  Systolic BP 564 332 951  Diastolic BP 67 66 63  Wt. (Lbs) 124 132 131.4  BMI 21.97 23.38 23.28      Physical Exam Constitutional:      General: She is not in acute distress.    Appearance: She is well-developed. She is not diaphoretic.  HENT:     Head: Normocephalic.     Right Ear: External ear normal.     Left Ear: External ear normal.     Nose: Nose normal.  Eyes:     Conjunctiva/sclera: Conjunctivae normal.     Pupils: Pupils are equal, round, and reactive to light.  Neck:     Vascular: No JVD.  Cardiovascular:     Rate and Rhythm: Normal rate and regular rhythm.     Heart sounds: Normal heart sounds. No murmur heard. No gallop.   Pulmonary:     Effort: Pulmonary effort is normal. No respiratory distress.     Breath sounds: Normal breath sounds. No wheezing or rales.  Chest:     Chest wall: No tenderness.  Breasts:     Right: No mass or tenderness.     Left: No mass or tenderness.    Abdominal:     General: Bowel sounds are normal. There is no distension.     Palpations: Abdomen is soft. There is no mass.     Tenderness: There is no abdominal tenderness.  Genitourinary:    Comments: External genitalia, vagina, cervix, adnexa-normal Musculoskeletal:        General: No tenderness. Normal range of motion.     Cervical back: Normal range of motion.  Skin:    General: Skin is warm and dry.  Neurological:     Mental Status: She is alert and oriented to person, place, and time.     Deep Tendon Reflexes: Reflexes are normal and symmetric.     CMP Latest Ref Rng & Units 02/07/2020 10/30/2018 07/20/2018  Glucose 65 - 99 mg/dL 85 98 102(H)  BUN 8 - 27 mg/dL _1 Creatinine 0.57 - 1.00 mg/dL 0.77 0.92 0.66  Sodium 134 - 144 mmol/L 138 142  139  Potassium 3.5 - 5.2 mmol/L 4.2 4.0 4.6  Chloride 96 - 106 mmol/L 102 105 102  CO2 20 - 29 mmol/L _2 Calcium 8.7 - 10.3 mg/dL 8.9 9.2 9.6  Total Protein 6.0 - 8.5 g/dL - - 7.2  Total Bilirubin 0.0 - 1.2 mg/dL - - 0.5  Alkaline Phos 39 - 117 IU/L - - 83  AST 0 - 40 IU/L - - 20  ALT 0 - 32 IU/L - - 7    Lipid Panel     Component Value Date/Time   CHOL 184 02/07/2020 1020   TRIG 75 02/07/2020 1020   HDL 53 02/07/2020 1020   CHOLHDL 3.5 02/07/2020 1020   CHOLHDL 3.4 10/30/2015 1004  VLDL 15 10/30/2015 1004   LDLCALC 117 (H) 02/07/2020 1020    CBC    Component Value Date/Time   WBC 6.3 10/30/2015 1004   RBC 4.69 10/30/2015 1004   HGB 13.6 10/30/2015 1004   HCT 39.9 10/30/2015 1004   PLT 253 10/30/2015 1004   MCV 85.1 10/30/2015 1004   MCV 85.9 03/10/2015 1216   MCH 29.0 10/30/2015 1004   MCHC 34.1 10/30/2015 1004   RDW 13.3 10/30/2015 1004   LYMPHSABS 2,709 10/30/2015 1004   MONOABS 504 10/30/2015 1004   EOSABS 126 10/30/2015 1004   BASOSABS 0 10/30/2015 1004    Lab Results  Component Value Date   HGBA1C 5.6 10/30/2018    Assessment & Plan:  1. Annual physical exam Counseled on 150 minutes of exercise per week, healthy eating (including decreased daily intake of saturated fats, cholesterol, added sugars, sodium), STI prevention, routine healthcare maintenance. - VITAMIN D 25 Hydroxy (Vit-D Deficiency, Fractures) - CBC with Differential/Platelet - CMP14+EGFR  2. Screening for cervical cancer - Cytology - PAP([place])  3. Other specified hypothyroidism Controlled Will send of thyroid panel and adjust regimen accordingly - TSH - T4, free  4. Reflux gastritis Uncontrolled Placed on omeprazole - omeprazole (PRILOSEC) 40 MG capsule; Take 1 capsule (40 mg total) by mouth daily.  Dispense: 30 capsule; Refill: 3    Meds ordered this encounter  Medications  . omeprazole (PRILOSEC) 40 MG capsule    Sig: Take 1 capsule (40 mg total) by mouth  daily.    Dispense:  30 capsule    Refill:  3    Follow-up: Return in about 6 months (around 12/17/2020) for Medical conditions.       Charlott Rakes, MD, FAAFP. [facility] and [facility], [place]   06/16/2020, 10:47 AM

## 2020-06-17 ENCOUNTER — Other Ambulatory Visit: Payer: Self-pay | Admitting: Family Medicine

## 2020-06-17 DIAGNOSIS — E038 Other specified hypothyroidism: Secondary | ICD-10-CM

## 2020-06-17 LAB — CBC WITH DIFFERENTIAL/PLATELET
Basophils Absolute: 0 10*3/uL (ref 0.0–0.2)
Basos: 1 %
EOS (ABSOLUTE): 0.2 10*3/uL (ref 0.0–0.4)
Eos: 3 %
Hematocrit: 42.3 % (ref 34.0–46.6)
Hemoglobin: 14.2 g/dL (ref 11.1–15.9)
Immature Grans (Abs): 0 10*3/uL (ref 0.0–0.1)
Immature Granulocytes: 0 %
Lymphocytes Absolute: 2.9 10*3/uL (ref 0.7–3.1)
Lymphs: 46 %
MCH: 28.7 pg (ref 26.6–33.0)
MCHC: 33.6 g/dL (ref 31.5–35.7)
MCV: 86 fL (ref 79–97)
Monocytes Absolute: 0.4 10*3/uL (ref 0.1–0.9)
Monocytes: 6 %
Neutrophils Absolute: 2.7 10*3/uL (ref 1.4–7.0)
Neutrophils: 44 %
Platelets: 243 10*3/uL (ref 150–450)
RBC: 4.95 x10E6/uL (ref 3.77–5.28)
RDW: 12.9 % (ref 11.7–15.4)
WBC: 6.2 10*3/uL (ref 3.4–10.8)

## 2020-06-17 LAB — CMP14+EGFR
ALT: 5 IU/L (ref 0–32)
AST: 20 IU/L (ref 0–40)
Albumin/Globulin Ratio: 1.5 (ref 1.2–2.2)
Albumin: 4.5 g/dL (ref 3.8–4.8)
Alkaline Phosphatase: 87 IU/L (ref 44–121)
BUN/Creatinine Ratio: 13 (ref 12–28)
BUN: 10 mg/dL (ref 8–27)
Bilirubin Total: 0.4 mg/dL (ref 0.0–1.2)
CO2: 23 mmol/L (ref 20–29)
Calcium: 9.4 mg/dL (ref 8.7–10.3)
Chloride: 103 mmol/L (ref 96–106)
Creatinine, Ser: 0.79 mg/dL (ref 0.57–1.00)
Globulin, Total: 3 g/dL (ref 1.5–4.5)
Glucose: 97 mg/dL (ref 65–99)
Potassium: 4.2 mmol/L (ref 3.5–5.2)
Sodium: 141 mmol/L (ref 134–144)
Total Protein: 7.5 g/dL (ref 6.0–8.5)
eGFR: 85 mL/min/{1.73_m2} (ref >59)

## 2020-06-17 LAB — VITAMIN D 25 HYDROXY (VIT D DEFICIENCY, FRACTURES): Vit D, 25-Hydroxy: 17.6 ng/mL — ABNORMAL LOW (ref 30.0–100.0)

## 2020-06-17 LAB — T4, FREE: Free T4: 2.08 ng/dL — ABNORMAL HIGH (ref 0.82–1.77)

## 2020-06-17 LAB — TSH: TSH: 0.294 u[IU]/mL — ABNORMAL LOW (ref 0.450–4.500)

## 2020-06-17 MED ORDER — LEVOTHYROXINE SODIUM 75 MCG PO TABS: ORAL_TABLET | ORAL | 6 refills | Status: DC

## 2020-06-17 MED ORDER — ERGOCALCIFEROL 1.25 MG (50000 UT) PO CAPS: 50000.0000 [IU] | ORAL_CAPSULE | ORAL | 1 refills | Status: DC

## 2020-06-19 LAB — CYTOLOGY - PAP
Comment: NEGATIVE
Diagnosis: NEGATIVE
High risk HPV: NEGATIVE

## 2020-06-20 ENCOUNTER — Telehealth: Payer: Self-pay

## 2020-06-20 NOTE — Telephone Encounter (Signed)
Patient name and DOB has been verified Patient was informed of lab results. Patient had no questions.  

## 2020-06-20 NOTE — Telephone Encounter (Signed)
-----   Message from Hoy Register, MD sent at 06/20/2020  9:54 AM EDT ----- Please inform her that her Pap smear is normal.

## 2020-11-24 ENCOUNTER — Ambulatory Visit: Payer: 59 | Attending: Nurse Practitioner | Admitting: Nurse Practitioner

## 2020-11-24 ENCOUNTER — Encounter: Payer: Self-pay | Admitting: Nurse Practitioner

## 2020-11-24 ENCOUNTER — Other Ambulatory Visit: Payer: Self-pay

## 2020-11-24 VITALS — BP 104/67 | HR 58 | Ht 63.0 in | Wt 130.4 lb

## 2020-11-24 DIAGNOSIS — Z23 Encounter for immunization: Secondary | ICD-10-CM | POA: Diagnosis not present

## 2020-11-24 DIAGNOSIS — E038 Other specified hypothyroidism: Secondary | ICD-10-CM

## 2020-11-24 DIAGNOSIS — E559 Vitamin D deficiency, unspecified: Secondary | ICD-10-CM

## 2020-11-24 DIAGNOSIS — Z1231 Encounter for screening mammogram for malignant neoplasm of breast: Secondary | ICD-10-CM

## 2020-11-24 MED ORDER — LEVOTHYROXINE SODIUM 75 MCG PO TABS: ORAL_TABLET | ORAL | 6 refills | Status: DC

## 2020-11-24 MED ORDER — ERGOCALCIFEROL 1.25 MG (50000 UT) PO CAPS: 50000.0000 [IU] | ORAL_CAPSULE | ORAL | 1 refills | Status: DC

## 2020-11-24 NOTE — Progress Notes (Signed)
Assessment & Plan:  Audrey Schwartz was seen today for thyroid problem.  Diagnoses and all orders for this visit:  Other specified hypothyroidism -     Thyroid Panel With TSH -     levothyroxine (SYNTHROID) 75 MCG tablet; TAKE ONE TABLET BY MOUTH ONE TIME DAILY before breakfast.  Vitamin D deficiency disease -     VITAMIN D 25 Hydroxy (Vit-D Deficiency, Fractures) -     ergocalciferol (DRISDOL) 1.25 MG (50000 UT) capsule; Take 1 capsule (50,000 Units total) by mouth once a week.  Breast cancer screening by mammogram -     MS DIGITAL SCREENING TOMO BILATERAL; Future   Patient has been counseled on age-appropriate routine health concerns for screening and prevention. These are reviewed and up-to-date. Referrals have been placed accordingly. Immunizations are up-to-date or declined.    Subjective:   Chief Complaint  Patient presents with   Thyroid Problem   Audrey Schwartz 60 y.o. female presents to office today for thyroid disorder.  She has a past medical history of Hypothyroidism and GERD (only takes PPI as needed  Patient has been counseled on age-appropriate routine health concerns for screening and prevention. These are reviewed and up-to-date. Referrals have been placed accordingly. Immunizations are up-to-date or declined.     Last colonoscopy was in 2015 and was normal with recommendation for repeat in 10 years. Last mammogram was negative for malignancy in 12/2019. Pap smear 06-06-2020 and normal  Hypothyroidism: Patient presents for evaluation of thyroid function. She denies fatigue, weight changes, heat/cold intolerance, bowel/skin changes or CVS symptoms.Taking levothyroxine 75 mg daily as prescribed.     Review of Systems  Constitutional:  Negative for fever, malaise/fatigue and weight loss.  HENT: Negative.  Negative for nosebleeds.   Eyes: Negative.  Negative for blurred vision, double vision and photophobia.  Respiratory: Negative.  Negative for cough and shortness  of breath.   Cardiovascular: Negative.  Negative for chest pain, palpitations and leg swelling.  Gastrointestinal: Negative.  Negative for heartburn, nausea and vomiting.  Musculoskeletal: Negative.  Negative for myalgias.  Neurological: Negative.  Negative for dizziness, focal weakness, seizures and headaches.  Psychiatric/Behavioral: Negative.  Negative for suicidal ideas.    Past Medical History:  Diagnosis Date   Bruises easily for a long time   saw doctor, no problem found   Complication of anesthesia 1997   slow to awaken after c section due to dentures accidentally left in   Headache(784.0)    and nausea occasionally   Hypothyroidism    No pertinent past medical history    PT IS A POOR HISTORIAN    Thyroid disease     Past Surgical History:  Procedure Laterality Date   CESAREAN SECTION  1997   COLONOSCOPY WITH PROPOFOL N/A 08/07/2013   Procedure: COLONOSCOPY WITH PROPOFOL;  Surgeon: Charolett Bumpers, MD;  Location: [facility];  Service: Endoscopy;  Laterality: N/A;    Family History  Problem Relation Age of Onset   Breast cancer Neg Hx     Social History Reviewed with no changes to be made today.   Outpatient Medications Prior to Visit  Medication Sig Dispense Refill   acetaminophen (TYLENOL) 325 MG tablet Take 650 mg by mouth every 6 (six) hours as needed.     cetirizine (ZYRTEC) 10 MG tablet Take 1 tablet (10 mg total) by mouth daily. 30 tablet 2   NON FORMULARY tetraciclina-and penicilina topical     omeprazole (PRILOSEC) 40 MG capsule Take 1 capsule (40 mg total)  by mouth daily. 30 capsule 3   polyethylene glycol powder (GLYCOLAX/MIRALAX) 17 GM/SCOOP powder Take 17 g by mouth daily. 3350 g 1   ergocalciferol (DRISDOL) 1.25 MG (50000 UT) capsule Take 1 capsule (50,000 Units total) by mouth once a week. 12 capsule 1   levothyroxine (SYNTHROID) 75 MCG tablet TAKE ONE TABLET BY MOUTH ONE TIME DAILY before breakfast. 30 tablet 6   No facility-administered  medications prior to visit.    Allergies  Allergen Reactions   Amoxicillin Nausea And Vomiting    Has patient had a PCN reaction causing immediate rash, facial/tongue/throat swelling, SOB or lightheadedness with hypotension: yes Has patient had a PCN reaction causing severe rash involving mucus membranes or skin necrosis: no Has patient had a PCN reaction that required hospitalization : no Has patient had a PCN reaction occurring within the last 10 years: yes If all of the above answers are "NO", then may proceed with Cephalosporin use.    Penicillins Nausea And Vomiting    Has patient had a PCN reaction causing immediate rash, facial/tongue/throat swelling, SOB or lightheadedness with hypotension: no Has patient had a PCN reaction causing severe rash involving mucus membranes or skin necrosis: no Has patient had a PCN reaction that required hospitalization: no Has patient had a PCN reaction occurring within the last 10 years: no If all of the above answers are "NO", then may proceed with Cephalosporin use.        Objective:    BP 104/67   Pulse (!) 58   Ht 5\' 3"  (1.6 m)   Wt 130 lb 6 oz (59.1 kg)   LMP 02/01/2009 (Approximate)   SpO2 100%   BMI 23.09 kg/m  Wt Readings from Last 3 Encounters:  11/24/20 130 lb 6 oz (59.1 kg)  06/16/20 124 lb (56.2 kg)  02/07/20 132 lb (59.9 kg)    Physical Exam Vitals and nursing note reviewed.  Constitutional:      Appearance: She is well-developed.  HENT:     Head: Normocephalic and atraumatic.  Cardiovascular:     Rate and Rhythm: Regular rhythm. Bradycardia present.     Heart sounds: Normal heart sounds. No murmur heard.   No friction rub. No gallop.  Pulmonary:     Effort: Pulmonary effort is normal. No tachypnea or respiratory distress.     Breath sounds: Normal breath sounds. No decreased breath sounds, wheezing, rhonchi or rales.  Chest:     Chest wall: No tenderness.  Abdominal:     General: Bowel sounds are normal.      Palpations: Abdomen is soft.  Musculoskeletal:        General: Normal range of motion.     Cervical back: Normal range of motion.  Skin:    General: Skin is warm and dry.  Neurological:     Mental Status: She is alert and oriented to person, place, and time.     Coordination: Coordination normal.  Psychiatric:        Behavior: Behavior normal. Behavior is cooperative.        Thought Content: Thought content normal.        Judgment: Judgment normal.         Patient has been counseled extensively about nutrition and exercise as well as the importance of adherence with medications and regular follow-up. The patient was given clear instructions to go to ER or return to medical center if symptoms don't improve, worsen or new problems develop. The patient verbalized understanding.  Follow-up: Return in about 6 months (around 05/25/2021) for thyroid Dr. Alvis Lemmings.   Claiborne Rigg, FNP-BC [facility] and [facility] [place], [place] [phone]   11/24/2020, 9:32 AM

## 2020-11-24 NOTE — Progress Notes (Signed)
Audrey Schwartz [phone]

## 2020-11-24 NOTE — Addendum Note (Signed)
Addended by: Vertis Kelch on: 11/24/2020 09:34 AM   Modules accepted: Orders

## 2020-11-25 LAB — THYROID PANEL WITH TSH
Free Thyroxine Index: 2.1 (ref 1.2–4.9)
T3 Uptake Ratio: 27 % (ref 24–39)
T4, Total: 7.8 ug/dL (ref 4.5–12.0)
TSH: 4.34 u[IU]/mL (ref 0.450–4.500)

## 2020-11-25 LAB — VITAMIN D 25 HYDROXY (VIT D DEFICIENCY, FRACTURES): Vit D, 25-Hydroxy: 13.8 ng/mL — ABNORMAL LOW (ref 30.0–100.0)

## 2021-02-23 ENCOUNTER — Ambulatory Visit: Payer: Self-pay | Admitting: *Deleted

## 2021-02-23 NOTE — Telephone Encounter (Signed)
Using [address] [place], #[id number].  Chief Complaint: Headache Symptoms: severe headache Frequency: 2 weeks ago Pertinent Negatives: Patient denies other symptoms Disposition: [] ED /[] Urgent Care (no appt availability in office) / [x] Appointment(In office/virtual)/ []  [facility]/ [] Home Care/ [] Refused Recommended Disposition /[] [facility]/ []  Follow-up with PCP Additional Notes: NA   Reason for Disposition  [1] MODERATE headache (e.g., interferes with normal activities) AND [2] present > 24 hours AND [3] unexplained  (Exceptions: analgesics not tried, typical migraine, or headache part of viral illness)  Answer Assessment - Initial Assessment Questions 1. LOCATION: "Where does it hurt?"      Shooting pains in her head 2. ONSET: "When did the headache start?" (Minutes, hours or days)      Two weeks 3. PATTERN: "Does the pain come and go, or has it been constant since it started?"     Comes and goes, lasts a long 4. SEVERITY: "How bad is the pain?" and "What does it keep you from doing?"  (e.g., Scale 1-10; mild, moderate, or severe)   - MILD (1-3): doesn't interfere with normal activities    - MODERATE (4-7): interferes with normal activities or awakens from sleep    - SEVERE (8-10): excruciating pain, unable to do any normal activities        8 5. RECURRENT SYMPTOM: "Have you ever had headaches before?" If Yes, ask: "When was the last time?" and "What happened that time?"      First time 6. CAUSE: "What do you think is causing the headache?"     unknown 7. MIGRAINE: "Have you been diagnosed with migraine headaches?" If Yes, ask: "Is this headache similar?"      no 8. HEAD INJURY: "Has there been any recent injury to the head?"      Has had headaches before 9. OTHER SYMPTOMS: "Do you have any other symptoms?" (fever, stiff neck, eye pain, sore throat, cold symptoms)     Fever one day 10. PREGNANCY: "Is there any chance you are pregnant?" "When  was your last menstrual period?"       no  Protocols used: Headache-A-AH

## 2021-02-24 ENCOUNTER — Encounter: Payer: Self-pay | Admitting: Nurse Practitioner

## 2021-02-24 ENCOUNTER — Ambulatory Visit: Payer: Self-pay | Attending: Nurse Practitioner | Admitting: Nurse Practitioner

## 2021-02-24 ENCOUNTER — Other Ambulatory Visit: Payer: Self-pay

## 2021-02-24 DIAGNOSIS — G44229 Chronic tension-type headache, not intractable: Secondary | ICD-10-CM

## 2021-02-24 MED ORDER — AMITRIPTYLINE HCL 10 MG PO TABS: 10.0000 mg | ORAL_TABLET | Freq: Every day | ORAL | 1 refills | Status: DC

## 2021-02-24 NOTE — Progress Notes (Signed)
Virtual Visit via Telephone Note Due to national recommendations of social distancing due to [place] 19, telehealth visit is felt to be most appropriate for this patient at this time.  I discussed the limitations, risks, security and privacy concerns of performing an evaluation and management service by telephone and the availability of in person appointments. I also discussed with the patient that there may be a patient responsible charge related to this service. The patient expressed understanding and agreed to proceed.    I connected with Audrey Schwartz on 02/24/21  at  11:10 AM EST  EDT by telephone and verified that I am speaking with the correct person using two identifiers.  Location of Patient: Private Residence   Location of Provider: [place] and [place] participating in Telemedicine visit: Geryl Rankins FNP-BC Leoma Blauser [id number] Spanish Interpreter   History of Present Illness: Telemedicine visit for: Migraine headaches  Onset 2 weeks ago however she reports she does have a history of headaches in the past for which she was prescribed medication that she can not recall the name of.  Currently Taking tylenol and ibuprofen with no relief.  Pain comes and goes and can lasts minutes to hours. There are no associated abnormal neurological symptoms such as visual disturbances, facial asymmetry, hemiparesis, or dysarthria.  Associated symptoms include nausea but no vomiting. She denies any head or neck injury or trauma. Pain is located in the temples and sometimes the top of the head. Occurring at no particular time of the day.   Past Medical History:  Diagnosis Date   Bruises easily for a long time   saw doctor, no problem found   Complication of anesthesia 1997   slow to awaken after c section due to dentures accidentally left in   Headache(784.0)    and nausea occasionally   Hypothyroidism    No pertinent past medical  history    PT IS A POOR HISTORIAN    Thyroid disease     Past Surgical History:  Procedure Laterality Date   CESAREAN SECTION  1997   COLONOSCOPY WITH PROPOFOL N/A 08/07/2013   Procedure: COLONOSCOPY WITH PROPOFOL;  Surgeon: Garlan Fair, MD;  Location: [facility];  Service: Endoscopy;  Laterality: N/A;    Family History  Problem Relation Age of Onset   Breast cancer Neg Hx     Social History   Socioeconomic History   Marital status: Single    Spouse name: Not on file   Number of children: Not on file   Years of education: Not on file   Highest education level: Not on file  Occupational History   Not on file  Tobacco Use   Smoking status: Never   Smokeless tobacco: Never  Substance and Sexual Activity   Alcohol use: No   Drug use: No   Sexual activity: Never    Birth control/protection: Abstinence  Other Topics Concern   Not on file  Social History Narrative   Not on file   Social Determinants of Health   Financial Resource Strain: Not on file  Food Insecurity: Not on file  Transportation Needs: Not on file  Physical Activity: Not on file  Stress: Not on file  Social Connections: Not on file     Observations/Objective: Awake, alert and oriented x 3   Review of Systems  Constitutional:  Negative for fever, malaise/fatigue and weight loss.  HENT: Negative.  Negative for nosebleeds.   Eyes: Negative.  Negative  for blurred vision, double vision and photophobia.  Respiratory: Negative.  Negative for cough and shortness of breath.   Cardiovascular: Negative.  Negative for chest pain, palpitations and leg swelling.  Gastrointestinal:  Positive for nausea. Negative for abdominal pain, blood in stool, constipation, diarrhea, heartburn, melena and vomiting.  Musculoskeletal: Negative.  Negative for myalgias.  Neurological:  Positive for headaches. Negative for dizziness, sensory change, speech change, focal weakness, seizures, loss of consciousness and weakness.   Psychiatric/Behavioral: Negative.  Negative for suicidal ideas.    Assessment and Plan: Diagnoses and all orders for this visit:  Chronic tension-type headache, not intractable -     amitriptyline (ELAVIL) 10 MG tablet; Take 1 tablet (10 mg total) by mouth at bedtime.     Follow Up Instructions Return if symptoms worsen or fail to improve.     I discussed the assessment and treatment plan with the patient. The patient was provided an opportunity to ask questions and all were answered. The patient agreed with the plan and demonstrated an understanding of the instructions.   The patient was advised to call back or seek an in-person evaluation if the symptoms worsen or if the condition fails to improve as anticipated.  I provided 12 minutes of non-face-to-face time during this encounter including median intraservice time, reviewing previous notes, labs, imaging, medications and explaining diagnosis and management.  Gildardo Pounds, FNP-BC

## 2021-03-23 ENCOUNTER — Ambulatory Visit
Admission: RE | Admit: 2021-03-23 | Discharge: 2021-03-23 | Disposition: A | Discharge status: Home / Self Care | Payer: 59 | Source: Ambulatory Visit | Attending: Nurse Practitioner | Admitting: Nurse Practitioner

## 2021-03-23 DIAGNOSIS — Z1231 Encounter for screening mammogram for malignant neoplasm of breast: Secondary | ICD-10-CM

## 2021-04-16 ENCOUNTER — Ambulatory Visit: Payer: 59 | Attending: Physician Assistant | Admitting: Physician Assistant

## 2021-04-16 ENCOUNTER — Other Ambulatory Visit: Payer: Self-pay

## 2021-04-16 VITALS — BP 125/77 | HR 68 | Resp 18 | Ht 63.0 in | Wt 132.0 lb

## 2021-04-16 DIAGNOSIS — R059 Cough, unspecified: Secondary | ICD-10-CM | POA: Diagnosis not present

## 2021-04-16 DIAGNOSIS — G44229 Chronic tension-type headache, not intractable: Secondary | ICD-10-CM | POA: Diagnosis not present

## 2021-04-16 DIAGNOSIS — E038 Other specified hypothyroidism: Secondary | ICD-10-CM | POA: Diagnosis not present

## 2021-04-16 DIAGNOSIS — E559 Vitamin D deficiency, unspecified: Secondary | ICD-10-CM | POA: Diagnosis not present

## 2021-04-16 MED ORDER — CETIRIZINE HCL 10 MG PO TABS: 10.0000 mg | ORAL_TABLET | Freq: Every day | ORAL | 1 refills | Status: DC

## 2021-04-16 MED ORDER — AMITRIPTYLINE HCL 10 MG PO TABS: 10.0000 mg | ORAL_TABLET | Freq: Every day | ORAL | 1 refills | Status: AC

## 2021-04-16 MED ORDER — LEVOTHYROXINE SODIUM 75 MCG PO TABS: ORAL_TABLET | ORAL | 1 refills | Status: DC

## 2021-04-16 NOTE — Progress Notes (Signed)
? ?Audrey Schwartz, is a 61 y.o. female ? ?[id number] ? ?[id number] ? ?DOB - Aug 08, 1960 ? ?Chief Complaint  ?Patient presents with  ? Thyroid Problem  ?    ? ?Subjective:  ? ?Audrey Schwartz is a 61 y.o. female here today for medication RF and her allergies are bothering her with post nasal drip and cough at night.  No new issues or concerns ? ?No problems updated. ? ?ALLERGIES: ?Allergies  ?Allergen Reactions  ? Amoxicillin Nausea And Vomiting  ?  Has patient had a PCN reaction causing immediate rash, facial/tongue/throat swelling, SOB or lightheadedness with hypotension: yes ?Has patient had a PCN reaction causing severe rash involving mucus membranes or skin necrosis: no ?Has patient had a PCN reaction that required hospitalization : no ?Has patient had a PCN reaction occurring within the last 10 years: yes ?If all of the above answers are "NO", then may proceed with Cephalosporin use. ?  ? Penicillins Nausea And Vomiting  ?  Has patient had a PCN reaction causing immediate rash, facial/tongue/throat swelling, SOB or lightheadedness with hypotension: no ?Has patient had a PCN reaction causing severe rash involving mucus membranes or skin necrosis: no ?Has patient had a PCN reaction that required hospitalization: no ?Has patient had a PCN reaction occurring within the last 10 years: no ?If all of the above answers are "NO", then may proceed with Cephalosporin use. ?  ? ? ?PAST MEDICAL HISTORY: ?Past Medical History:  ?Diagnosis Date  ? Bruises easily for a long time  ? saw doctor, no problem found  ? Complication of anesthesia 1997  ? slow to awaken after c section due to dentures accidentally left in  ? Headache(784.0)   ? and nausea occasionally  ? Hypothyroidism   ? No pertinent past medical history   ? PT IS A POOR HISTORIAN   ? Thyroid disease   ? ? ?MEDICATIONS AT HOME: ?Prior to Admission medications   ?Medication Sig Start Date End Date Taking? Authorizing Provider  ?acetaminophen (TYLENOL) 325 MG tablet  Take 650 mg by mouth every 6 (six) hours as needed.   Yes [provider]  ?ergocalciferol (DRISDOL) 1.25 MG (50000 UT) capsule Take 1 capsule (50,000 Units total) by mouth once a week. 11/24/20  Yes Claiborne Rigg, NP  ?NON FORMULARY tetraciclina-and penicilina topical   Yes [provider]  ?omeprazole (PRILOSEC) 40 MG capsule Take 1 capsule (40 mg total) by mouth daily. 06/16/20  Yes Hoy Register, MD  ?polyethylene glycol powder (GLYCOLAX/MIRALAX) 17 GM/SCOOP powder Take 17 g by mouth daily. 10/30/18  Yes Hoy Register, MD  ?amitriptyline (ELAVIL) 10 MG tablet Take 1 tablet (10 mg total) by mouth at bedtime. 04/16/21 05/16/21  Anders Simmonds, PA-C  ?cetirizine (ZYRTEC) 10 MG tablet Take 1 tablet (10 mg total) by mouth daily. 04/16/21   Anders Simmonds, PA-C  ?levothyroxine (SYNTHROID) 75 MCG tablet TAKE ONE TABLET BY MOUTH ONE TIME DAILY before breakfast. 04/16/21   Anders Simmonds, PA-C  ? ? ?ROS: ?Neg resp ?Neg cardiac ?Neg GI ?Neg GU ?Neg MS ?Neg psych ?Neg neuro ? ?Objective:  ? ?Vitals:  ? 04/16/21 1627  ?BP: 125/77  ?Pulse: 68  ?Resp: 18  ?SpO2: 100%  ?Weight: 132 lb (59.9 kg)  ?Height: 5\' 3"  (1.6 m)  ? ?Exam ?General appearance : Awake, alert, not in any distress. Speech Clear. Not toxic looking ?HEENT: Atraumatic and Normocephalic, ? Turbinates bluish, throat with PND ?Neck: Supple, no JVD. No cervical lymphadenopathy.  ?Chest: Good  air entry bilaterally, CTAB.  No rales/rhonchi/wheezing ?CVS: S1 S2 regular, no murmurs.  ?Extremities: B/L Lower Ext shows no edema, both legs are warm to touch ?Neurology: Awake alert, and oriented X 3, CN II-XII intact, Non focal ?Skin: No Rash ? ?Data Review ?Lab Results  ?Component Value Date  ? HGBA1C 5.6 10/30/2018  ? HGBA1C 6.3 (H) 02/26/2009  ? ? ?Assessment & Plan  ? ?1. Chronic tension-type headache, not intractable ?- amitriptyline (ELAVIL) 10 MG tablet; Take 1 tablet (10 mg total) by mouth at bedtime.  Dispense: 90 tablet; Refill:  1 ? ?2.other specified hypothyroidism ?- levothyroxine (SYNTHROID) 75 MCG tablet; TAKE ONE TABLET BY MOUTH ONE TIME DAILY before breakfast.  Dispense: 90 tablet; Refill: 1 ?- Thyroid Panel With TSH ? ?3. Cough ?- cetirizine (ZYRTEC) 10 MG tablet; Take 1 tablet (10 mg total) by mouth daily.  Dispense: 90 tablet; Refill: 1 ? ?4. Vitamin D deficiency disease ?- Vitamin D, 25-hydroxy ? ? ? ?Patient have been counseled extensively about nutrition and exercise. Other issues discussed during this visit include: low cholesterol diet, weight control and daily exercise, foot care, annual eye examinations at Ophthalmology, importance of adherence with medications and regular follow-up. We also discussed long term complications of uncontrolled diabetes and hypertension.  ? ?Return in about 6 months (around 10/17/2021) for PCP for chronic conditions. ? ?The patient was given clear instructions to go to ER or return to medical center if symptoms don't improve, worsen or new problems develop. The patient verbalized understanding. The patient was told to call to get lab results if they haven't heard anything in the next week.  ? ? ? ? ?Georgian Co, PA-C ?[facility] ?[place], [place] ?[phone]   ?04/16/2021, 4:45 PM Patient ID: Audrey Schwartz, female   DOB: 1961-01-14, 61 y.o.   MRN: [id number] ? ?

## 2021-04-17 ENCOUNTER — Ambulatory Visit: Payer: 59 | Attending: Family Medicine

## 2021-04-18 LAB — THYROID PANEL WITH TSH
Free Thyroxine Index: 2.6 (ref 1.2–4.9)
T3 Uptake Ratio: 28 % (ref 24–39)
T4, Total: 9.3 ug/dL (ref 4.5–12.0)
TSH: 2.25 u[IU]/mL (ref 0.450–4.500)

## 2021-04-18 LAB — VITAMIN D 25 HYDROXY (VIT D DEFICIENCY, FRACTURES): Vit D, 25-Hydroxy: 26.7 ng/mL — ABNORMAL LOW (ref 30.0–100.0)

## 2021-07-09 ENCOUNTER — Encounter: Payer: Self-pay | Admitting: Family Medicine

## 2021-07-09 ENCOUNTER — Ambulatory Visit: Payer: 59 | Attending: Family Medicine | Admitting: Family Medicine

## 2021-07-09 VITALS — BP 113/63 | HR 60 | Temp 97.9°F | Ht 63.0 in | Wt 130.4 lb

## 2021-07-09 DIAGNOSIS — L309 Dermatitis, unspecified: Secondary | ICD-10-CM | POA: Diagnosis not present

## 2021-07-09 MED ORDER — CLOBETASOL PROPIONATE 0.05 % EX CREA: 1.0000 "application " | TOPICAL_CREAM | Freq: Two times a day (BID) | CUTANEOUS | 1 refills | Status: DC

## 2021-07-09 NOTE — Progress Notes (Signed)
Patient has 2 bumps on upper chest are Has rash on left ankle.

## 2021-07-09 NOTE — Progress Notes (Signed)
Subjective:  Patient ID: Audrey Schwartz, female    DOB: [birth year]  Age: 61 y.o. MRN: [id number]  CC: Rash   HPI Audrey Schwartz is a 61 y.o. year old female with a history of hypothyroidism, GERD, insomnia.  Interval History: She noticed two bumps on her anterior  chest wall for the last couple of days which is slightly itchy only when she scratches them She also has rash on her legs. On her left leg rash started out small, itchy and increased in size to a large patch. She was previously prescribed a cream which brought about resolution only for lesion to return 6 months ago.  She also has 2 itchy lesions on her right shin.    Past Medical History:  Diagnosis Date   Bruises easily for a long time   saw doctor, no problem found   Complication of anesthesia 1997   slow to awaken after c section due to dentures accidentally left in   Headache(784.0)    and nausea occasionally   Hypothyroidism    No pertinent past medical history    PT IS A POOR HISTORIAN    Thyroid disease     Past Surgical History:  Procedure Laterality Date   CESAREAN SECTION  1997   COLONOSCOPY WITH PROPOFOL N/A 08/07/2013   Procedure: COLONOSCOPY WITH PROPOFOL;  Surgeon: Charolett Bumpers, MD;  Location: [facility];  Service: Endoscopy;  Laterality: N/A;    Family History  Problem Relation Age of Onset   Breast cancer Neg Hx     Social History   Socioeconomic History   Marital status: Single    Spouse name: Not on file   Number of children: Not on file   Years of education: Not on file   Highest education level: Not on file  Occupational History   Not on file  Tobacco Use   Smoking status: Never   Smokeless tobacco: Never  Substance and Sexual Activity   Alcohol use: No   Drug use: No   Sexual activity: Never    Birth control/protection: Abstinence  Other Topics Concern   Not on file  Social History Narrative   Not on file   Social Determinants of Health   Financial  Resource Strain: Not on file  Food Insecurity: Not on file  Transportation Needs: Not on file  Physical Activity: Not on file  Stress: Not on file  Social Connections: Not on file    Allergies  Allergen Reactions   Amoxicillin Nausea And Vomiting    Has patient had a PCN reaction causing immediate rash, facial/tongue/throat swelling, SOB or lightheadedness with hypotension: yes Has patient had a PCN reaction causing severe rash involving mucus membranes or skin necrosis: no Has patient had a PCN reaction that required hospitalization : no Has patient had a PCN reaction occurring within the last 10 years: yes If all of the above answers are "NO", then may proceed with Cephalosporin use.    Penicillins Nausea And Vomiting    Has patient had a PCN reaction causing immediate rash, facial/tongue/throat swelling, SOB or lightheadedness with hypotension: no Has patient had a PCN reaction causing severe rash involving mucus membranes or skin necrosis: no Has patient had a PCN reaction that required hospitalization: no Has patient had a PCN reaction occurring within the last 10 years: no If all of the above answers are "NO", then may proceed with Cephalosporin use.     Outpatient Medications Prior to Visit  Medication Sig Dispense  Refill   acetaminophen (TYLENOL) 325 MG tablet Take 650 mg by mouth every 6 (six) hours as needed.     cetirizine (ZYRTEC) 10 MG tablet Take 1 tablet (10 mg total) by mouth daily. 90 tablet 1   ergocalciferol (DRISDOL) 1.25 MG (50000 UT) capsule Take 1 capsule (50,000 Units total) by mouth once a week. 12 capsule 1   levothyroxine (SYNTHROID) 75 MCG tablet TAKE ONE TABLET BY MOUTH ONE TIME DAILY before breakfast. 90 tablet 1   NON FORMULARY tetraciclina-and penicilina topical     omeprazole (PRILOSEC) 40 MG capsule Take 1 capsule (40 mg total) by mouth daily. 30 capsule 3   polyethylene glycol powder (GLYCOLAX/MIRALAX) 17 GM/SCOOP powder Take 17 g by mouth daily.  3350 g 1   amitriptyline (ELAVIL) 10 MG tablet Take 1 tablet (10 mg total) by mouth at bedtime. 90 tablet 1   No facility-administered medications prior to visit.     ROS Review of Systems  Constitutional:  Negative for activity change, appetite change and fatigue.  HENT:  Negative for congestion, sinus pressure and sore throat.   Eyes:  Negative for visual disturbance.  Respiratory:  Negative for cough, chest tightness, shortness of breath and wheezing.   Cardiovascular:  Negative for chest pain and palpitations.  Gastrointestinal:  Negative for abdominal distention, abdominal pain and constipation.  Endocrine: Negative for polydipsia.  Genitourinary:  Negative for dysuria and frequency.  Musculoskeletal:  Negative for arthralgias and back pain.  Skin:        See HPI  Neurological:  Negative for tremors, light-headedness and numbness.  Hematological:  Does not bruise/bleed easily.  Psychiatric/Behavioral:  Negative for agitation and behavioral problems.     Objective:  BP 113/63   Pulse 60   Temp 97.9 F (36.6 C) (Oral)   Ht 5\' 3"  (1.6 m)   Wt 130 lb 6.4 oz (59.1 kg)   LMP 02/01/2009 (Approximate)   SpO2 97%   BMI 23.10 kg/m      07/09/2021   10:56 AM 04/16/2021    4:27 PM 11/24/2020    9:14 AM  BP/Weight  Systolic BP 113 125 104  Diastolic BP 63 77 67  Wt. (Lbs) 130.4 132 130.38  BMI 23.1 kg/m2 23.38 kg/m2 23.09 kg/m2      Physical Exam Constitutional:      Appearance: She is well-developed.  Cardiovascular:     Rate and Rhythm: Normal rate.     Heart sounds: Normal heart sounds. No murmur heard. Pulmonary:     Effort: Pulmonary effort is normal.     Breath sounds: Normal breath sounds. No wheezing or rales.  Chest:     Chest wall: No tenderness.  Abdominal:     General: Bowel sounds are normal. There is no distension.     Palpations: Abdomen is soft. There is no mass.     Tenderness: There is no abdominal tenderness.  Musculoskeletal:        General:  Normal range of motion.     Right lower leg: No edema.     Left lower leg: No edema.  Skin:    Comments: R shin with dry scaly papule  Neurological:     Mental Status: She is alert and oriented to person, place, and time.  Psychiatric:        Mood and Affect: Mood normal.         Latest Ref Rng & Units 06/16/2020   10:49 AM 02/07/2020   10:20 AM 10/30/2018  9:28 AM  CMP  Glucose 65 - 99 mg/dL 97  85  98   BUN 8 - 27 mg/dL 10  16  16    Creatinine 0.57 - 1.00 mg/dL 1.610.79  0.960.77  0.450.92   Sodium 134 - 144 mmol/L 141  138  142   Potassium 3.5 - 5.2 mmol/L 4.2  4.2  4.0   Chloride 96 - 106 mmol/L 103  102  105   CO2 20 - 29 mmol/L 23  22  24    Calcium 8.7 - 10.3 mg/dL 9.4  8.9  9.2   Total Protein 6.0 - 8.5 g/dL 7.5     Total Bilirubin 0.0 - 1.2 mg/dL 0.4     Alkaline Phos 44 - 121 IU/L 87     AST 0 - 40 IU/L 20     ALT 0 - 32 IU/L 5       Lipid Panel     Component Value Date/Time   CHOL 184 02/07/2020 1020   TRIG 75 02/07/2020 1020   HDL 53 02/07/2020 1020   CHOLHDL 3.5 02/07/2020 1020   CHOLHDL 3.4 10/30/2015 1004   VLDL 15 10/30/2015 1004   LDLCALC 117 (H) 02/07/2020 1020    CBC    Component Value Date/Time   WBC 6.2 06/16/2020 1049   WBC 6.3 10/30/2015 1004   RBC 4.95 06/16/2020 1049   RBC 4.69 10/30/2015 1004   HGB 14.2 06/16/2020 1049   HCT 42.3 06/16/2020 1049   PLT 243 06/16/2020 1049   MCV 86 06/16/2020 1049   MCH 28.7 06/16/2020 1049   MCH 29.0 10/30/2015 1004   MCHC 33.6 06/16/2020 1049   MCHC 34.1 10/30/2015 1004   RDW 12.9 06/16/2020 1049   LYMPHSABS 2.9 06/16/2020 1049   MONOABS 504 10/30/2015 1004   EOSABS 0.2 06/16/2020 1049   BASOSABS 0.0 06/16/2020 1049    Lab Results  Component Value Date   HGBA1C 5.6 10/30/2018    Assessment & Plan:  1. Dermatitis Patient seems suspicious for psoriasis but she has no other lesions of the body parts We will place on topical steroid - clobetasol cream (TEMOVATE) 0.05 %; Apply 1 application. topically  2 (two) times daily.  Dispense: 60 g; Refill: 1    Meds ordered this encounter  Medications   clobetasol cream (TEMOVATE) 0.05 %    Sig: Apply 1 application. topically 2 (two) times daily.    Dispense:  60 g    Refill:  1    Follow-up: Return in about 3 months (around 10/09/2021) for Chronic medical conditions.       Hoy RegisterEnobong Lei Dower, MD, FAAFP. [facility] [place], [place] [phone]   07/09/2021, 1:55 PM

## 2021-07-09 NOTE — Patient Instructions (Signed)
Atopic Dermatitis ?Atopic dermatitis is a skin disorder that causes inflammation of the skin. It is marked by a red rash and itchy, dry, scaly skin. It is the most common type of eczema. Eczema is a group of skin conditions that cause the skin to become rough and swollen. This condition is generally worse during the cooler winter months and often improves during the warm summer months. ?Atopic dermatitis usually starts showing signs in infancy and can last through adulthood. This condition cannot be passed from one person to another (is not contagious). Atopic dermatitis may not always be present, but when it is, it is called a flare-up. ?What are the causes? ?The exact cause of this condition is not known. Flare-ups may be triggered by: ?Coming in contact with something that you are sensitive or allergic to (allergen). ?Stress. ?Certain foods. ?Extremely hot or cold weather. ?Harsh chemicals and soaps. ?Dry air. ?Chlorine. ?What increases the risk? ?This condition is more likely to develop in people who have a personal or family history of: ?Eczema. ?Allergies. ?Asthma. ?Hay fever. ?What are the signs or symptoms? ?Symptoms of this condition include: ?Dry, scaly skin. ?Red, itchy rash. ?Itchiness, which can be severe. This may occur before the skin rash. This can make sleeping difficult. ?Skin thickening and cracking that can occur over time. ?How is this diagnosed? ?This condition is diagnosed based on: ?Your symptoms. ?Your medical history. ?A physical exam. ?How is this treated? ?There is no cure for this condition, but symptoms can usually be controlled. Treatment focuses on: ?Controlling the itchiness and scratching. You may be given medicines, such as antihistamines or steroid creams. ?Limiting exposure to allergens. ?Recognizing situations that cause stress and developing a plan to manage stress. ?If your atopic dermatitis does not get better with medicines, or if it is all over your body (widespread), a  treatment using a specific type of light (phototherapy) may be used. ?Follow these instructions at home: ?Skin care ? ?Keep your skin well moisturized. Doing this seals in moisture and helps to prevent dryness. ?Use unscented lotions that have petroleum in them. ?Avoid lotions that contain alcohol or water. They can dry the skin. ?Keep baths or showers short (less than 5 minutes) in warm water. Do not use hot water. ?Use mild, unscented cleansers for bathing. Avoid soap and bubble bath. ?Apply a moisturizer to your skin right after a bath or shower. ?Do not apply anything to your skin without checking with your health care provider. ?General instructions ?Take or apply over-the-counter and prescription medicines only as told by your health care provider. ?Dress in clothes made of cotton or cotton blends. Dress lightly because heat increases itchiness. ?When washing your clothes, rinse your clothes twice so all of the soap is removed. ?Avoid any triggers that can cause a flare-up. ?Keep your fingernails cut short. ?Avoid scratching. Scratching makes the rash and itchiness worse. A break in the skin from scratching could result in a skin infection (impetigo). ?Do not be around people who have cold sores or fever blisters. If you get the infection, it may cause your atopic dermatitis to worsen. ?Keep all follow-up visits. This is important. ?Contact a health care provider if: ?Your itchiness interferes with sleep. ?Your rash gets worse or is not better within one week of starting treatment. ?You have a fever. ?You have a rash flare-up after having contact with someone who has cold sores or fever blisters. ?Get help right away if: ?You develop pus or soft yellow scabs in the rash   area. ?Summary ?Atopic dermatitis causes a red rash and itchy, dry, scaly skin. ?Treatment focuses on controlling the itchiness and scratching, limiting exposure to things that you are sensitive or allergic to (allergens), recognizing  situations that cause stress, and developing a plan to manage stress. ?Keep your skin well moisturized. ?Keep baths or showers shorter than 5 minutes and use warm water. Do not use hot water. ?This information is not intended to replace advice given to you by your health care provider. Make sure you discuss any questions you have with your health care provider. ?Document Revised: 10/29/2019 Document Reviewed: 10/29/2019 ?Elsevier Patient Education ? 2023 Elsevier Inc. ? ?

## 2021-08-11 ENCOUNTER — Other Ambulatory Visit: Payer: Self-pay | Admitting: Family Medicine

## 2021-08-11 DIAGNOSIS — E038 Other specified hypothyroidism: Secondary | ICD-10-CM

## 2021-08-11 NOTE — Telephone Encounter (Signed)
Medication Refill - Medication: levothyroxine (SYNTHROID) 75 MCG tablet  Has the patient contacted their pharmacy? Yes.   (Agent: If no, request that the patient contact the pharmacy for the refill. If patient does not wish to contact the pharmacy document the reason why and proceed with request.) (Agent: If yes, when and what did the pharmacy advise?)  Preferred Pharmacy (with phone number or street name):  [id number] - [place], [place] - [address] Phone:  [phone]  Fax:  [phone]     Has the patient been seen for an appointment in the last year OR does the patient have an upcoming appointment? Yes.    Agent: Please be advised that RX refills may take up to 3 business days. We ask that you follow-up with your pharmacy.  Patient states the insurance will only cover thirty pills and not 90

## 2021-08-12 MED ORDER — LEVOTHYROXINE SODIUM 75 MCG PO TABS: ORAL_TABLET | ORAL | 1 refills | Status: DC

## 2021-08-12 NOTE — Telephone Encounter (Signed)
No refills available. Insurance will only cover #30. Requested Prescriptions  Pending Prescriptions Disp Refills  . levothyroxine (SYNTHROID) 75 MCG tablet 30 tablet 1    Sig: TAKE ONE TABLET BY MOUTH ONE TIME DAILY before breakfast.     Endocrinology:  Hypothyroid Agents Passed - 08/11/2021  4:53 PM      Passed - TSH in normal range and within 360 days    TSH  Date Value Ref Range Status  04/17/2021 2.250 0.450 - 4.500 uIU/mL Final         Passed - Valid encounter within last 12 months    Recent Outpatient Visits          1 month ago Dermatitis   [facility] [place], Odette Horns, MD   3 months ago Vitamin D deficiency disease   [place] [facility] [place], [place], [place]   5 months ago Chronic tension-type headache, not intractable   [facility] [place], Shea Stakes, NP   8 months ago Other specified hypothyroidism   [place] [address] [facility] [place], Shea Stakes, NP   1 year ago Annual physical exam   [facility] Hoy Register, MD      Future Appointments            In 2 months Hoy Register, MD [facility]

## 2021-09-12 ENCOUNTER — Other Ambulatory Visit: Payer: Self-pay | Admitting: Family Medicine

## 2021-09-12 DIAGNOSIS — L309 Dermatitis, unspecified: Secondary | ICD-10-CM

## 2021-09-14 NOTE — Telephone Encounter (Signed)
Requested medication (s) are due for refill today: yes  Requested medication (s) are on the active medication list: yes  Last refill:  07/09/21 60 g with 1 RF  Future visit scheduled: yes 10/19/21, seen 07/09/21  Notes to clinic:  This medication can not be delegated, please assess.        Requested Prescriptions  Pending Prescriptions Disp Refills   clobetasol cream (TEMOVATE) 0.05 % [Pharmacy Med Name: Clobetasol Propionate External Cream 0.05 %] 60 g 0    Sig: APPLY 1 APPLICATION TOPICALLY 2 TIMES DAILY     Not Delegated - Dermatology:  Corticosteroids Failed - 09/12/2021 10:05 AM      Failed - This refill cannot be delegated      Passed - Valid encounter within last 12 months    Recent Outpatient Visits           2 months ago Dermatitis   [facility] [facility] Hoy Register, MD   5 months ago Vitamin D deficiency disease   [place] [facility] [place], [place], [place]   6 months ago Chronic tension-type headache, not intractable   [facility] [place], Shea Stakes, NP   9 months ago Other specified hypothyroidism   [place] [address] [facility] [place], Shea Stakes, NP   1 year ago Annual physical exam   [facility] Hoy Register, MD       Future Appointments             In 1 month Hoy Register, MD [facility]

## 2021-10-09 ENCOUNTER — Other Ambulatory Visit: Payer: Self-pay | Admitting: Family Medicine

## 2021-10-09 DIAGNOSIS — E038 Other specified hypothyroidism: Secondary | ICD-10-CM

## 2021-10-09 NOTE — Telephone Encounter (Signed)
Medication Refill - Medication: levothyroxine (SYNTHROID) 75 MCG tablet  Has the patient contacted their pharmacy? No.  (Agent: If no, request that the patient contact the pharmacy for the refill. If patient does not wish to contact the pharmacy document the reason why and proceed with request.)   Preferred Pharmacy (with phone number or street name):  [facility] [address] [place] - [address]  [address] [place] [address]  Phone: [phone] Fax: [phone]  Hours: Not open 24 hours   Has the patient been seen for an appointment in the last year OR does the patient have an upcoming appointment? Yes.    Agent: Please be advised that RX refills may take up to 3 business days. We ask that you follow-up with your pharmacy.

## 2021-10-12 ENCOUNTER — Other Ambulatory Visit: Payer: Self-pay | Admitting: Physician Assistant

## 2021-10-12 DIAGNOSIS — E038 Other specified hypothyroidism: Secondary | ICD-10-CM

## 2021-10-12 MED ORDER — LEVOTHYROXINE SODIUM 75 MCG PO TABS: ORAL_TABLET | ORAL | 1 refills | Status: DC

## 2021-10-12 NOTE — Telephone Encounter (Signed)
Requested Prescriptions  Pending Prescriptions Disp Refills  . levothyroxine (SYNTHROID) 75 MCG tablet 30 tablet 1    Sig: TAKE ONE TABLET BY MOUTH ONE TIME DAILY before breakfast.     Endocrinology:  Hypothyroid Agents Passed - 10/09/2021 11:35 AM      Passed - TSH in normal range and within 360 days    TSH  Date Value Ref Range Status  04/17/2021 2.250 0.450 - 4.500 uIU/mL Final         Passed - Valid encounter within last 12 months    Recent Outpatient Visits          3 months ago Dermatitis   [facility] [place], Odette Horns, MD   5 months ago Vitamin D deficiency disease   [place] [facility] [place], [place], [place]   7 months ago Chronic tension-type headache, not intractable   [place] [facility] [place], Shea Stakes, NP   10 months ago Other specified hypothyroidism   [place] [address] [facility] [place], Shea Stakes, NP   1 year ago Annual physical exam   [facility] Hoy Register, MD      Future Appointments            In 1 week Hoy Register, MD [facility]

## 2021-10-13 NOTE — Telephone Encounter (Signed)
Duplicate request. Requested Prescriptions  Pending Prescriptions Disp Refills  . levothyroxine (SYNTHROID) 75 MCG tablet [Pharmacy Med Name: Levothyroxine Sodium Oral Tablet 75 MCG] 30 tablet 0    Sig: TAKE ONE TABLET BY MOUTH DAILY BEFORE BREAKFAST     Endocrinology:  Hypothyroid Agents Passed - 10/12/2021 11:31 AM      Passed - TSH in normal range and within 360 days    TSH  Date Value Ref Range Status  04/17/2021 2.250 0.450 - 4.500 uIU/mL Final         Passed - Valid encounter within last 12 months    Recent Outpatient Visits          3 months ago Dermatitis   [facility] Hoy Register, MD   6 months ago Vitamin D deficiency disease   [place] [facility] [place], [place], [place]   7 months ago Chronic tension-type headache, not intractable   [place] [facility] [place], Shea Stakes, NP   10 months ago Other specified hypothyroidism   [place] [address] [facility] [place], Shea Stakes, NP   1 year ago Annual physical exam   [facility] Hoy Register, MD      Future Appointments            In 6 days Hoy Register, MD [facility]

## 2021-10-19 ENCOUNTER — Encounter: Payer: Self-pay | Admitting: Family Medicine

## 2021-10-19 ENCOUNTER — Ambulatory Visit: Payer: 59 | Attending: Family Medicine | Admitting: Family Medicine

## 2021-10-19 VITALS — BP 100/61 | HR 56 | Ht 63.0 in | Wt 125.6 lb

## 2021-10-19 DIAGNOSIS — R42 Dizziness and giddiness: Secondary | ICD-10-CM

## 2021-10-19 DIAGNOSIS — E039 Hypothyroidism, unspecified: Secondary | ICD-10-CM

## 2021-10-19 DIAGNOSIS — Z131 Encounter for screening for diabetes mellitus: Secondary | ICD-10-CM

## 2021-10-19 DIAGNOSIS — Z23 Encounter for immunization: Secondary | ICD-10-CM

## 2021-10-19 DIAGNOSIS — E559 Vitamin D deficiency, unspecified: Secondary | ICD-10-CM

## 2021-10-19 MED ORDER — MECLIZINE HCL 25 MG PO TABS: 25.0000 mg | ORAL_TABLET | Freq: Two times a day (BID) | ORAL | 1 refills | Status: AC | PRN

## 2021-10-19 NOTE — Progress Notes (Signed)
Subjective:  Patient ID: Audrey Schwartz, female    DOB: 1960/07/01  Age: 61 y.o. MRN: [id number]  CC: Follow-up (6 mo f/u. Sallye Ober & blurry vision x1 week ), Dizziness, and Blurred Vision   HPI Audrey Schwartz is a 61 y.o. year old female with a history of hypothyroidism, GERD, insomnia  Interval History: She has ben having blurry vision and dizziness for the last 1 week. 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 It has happened twice in the last week. She has no tinnitus, nausea, vomiting. Symptoms are absent at the moment.  She is on Levothyroxine for her Hypothyroidism. Past Medical History:  Diagnosis Date   Bruises easily for a long time   saw doctor, no problem found   Complication of anesthesia 1997   slow to awaken after c section due to dentures accidentally left in   Headache(784.0)    and nausea occasionally   Hypothyroidism    No pertinent past medical history    PT IS A POOR HISTORIAN    Thyroid disease     Past Surgical History:  Procedure Laterality Date   CESAREAN SECTION  1997   COLONOSCOPY WITH PROPOFOL N/A 08/07/2013   Procedure: COLONOSCOPY WITH PROPOFOL;  Surgeon: Charolett Bumpers, MD;  Location: [facility];  Service: Endoscopy;  Laterality: N/A;    Family History  Problem Relation Age of Onset   Breast cancer Neg Hx     Social History   Socioeconomic History   Marital status: Single    Spouse name: Not on file   Number of children: Not on file   Years of education: Not on file   Highest education level: Not on file  Occupational History   Not on file  Tobacco Use   Smoking status: Never   Smokeless tobacco: Never  Substance and Sexual Activity   Alcohol use: No   Drug use: No    Sexual activity: Never    Birth control/protection: Abstinence  Other Topics Concern   Not on file  Social History Narrative   Not on file   Social Determinants of Health   Financial Resource Strain: Not on file  Food Insecurity: Not on file  Transportation Needs: Not on file  Physical Activity: Not on file  Stress: Not on file  Social Connections: Not on file    Allergies  Allergen Reactions   Amoxicillin Nausea And Vomiting    Has patient had a PCN reaction causing immediate rash, facial/tongue/throat swelling, SOB or lightheadedness with hypotension: yes Has patient had a PCN reaction causing severe rash involving mucus membranes or skin necrosis: no Has patient had a PCN reaction that required hospitalization : no Has patient had a PCN reaction occurring within the last 10 years: yes If all of the above answers are "NO", then may proceed with Cephalosporin use.    Penicillins Nausea And Vomiting    Has patient had a PCN reaction causing immediate rash, facial/tongue/throat swelling, SOB or lightheadedness with hypotension: no Has patient had a PCN reaction causing severe rash involving mucus membranes or skin necrosis: no Has patient had a PCN reaction that required hospitalization: no Has patient had a PCN reaction occurring within the last 10 years: no If all of  the above answers are "NO", then may proceed with Cephalosporin use.     Outpatient Medications Prior to Visit  Medication Sig Dispense Refill   levothyroxine (SYNTHROID) 75 MCG tablet TAKE ONE TABLET BY MOUTH ONE TIME DAILY before breakfast. 30 tablet 1   amitriptyline (ELAVIL) 10 MG tablet Take 1 tablet (10 mg total) by mouth at bedtime. 90 tablet 1   acetaminophen (TYLENOL) 325 MG tablet Take 650 mg by mouth every 6 (six) hours as needed. (Patient not taking: Reported on 10/19/2021)     cetirizine (ZYRTEC) 10 MG tablet Take 1 tablet (10 mg total) by mouth daily. (Patient not taking: Reported on 10/19/2021) 90  tablet 1   clobetasol cream (TEMOVATE) 0.05 % APPLY 1 APPLICATION TOPICALLY 2 TIMES DAILY (Patient not taking: Reported on 10/19/2021) 60 g 0   ergocalciferol (DRISDOL) 1.25 MG (50000 UT) capsule Take 1 capsule (50,000 Units total) by mouth once a week. (Patient not taking: Reported on 10/19/2021) 12 capsule 1   NON FORMULARY tetraciclina-and penicilina topical (Patient not taking: Reported on 10/19/2021)     omeprazole (PRILOSEC) 40 MG capsule Take 1 capsule (40 mg total) by mouth daily. (Patient not taking: Reported on 10/19/2021) 30 capsule 3   polyethylene glycol powder (GLYCOLAX/MIRALAX) 17 GM/SCOOP powder Take 17 g by mouth daily. (Patient not taking: Reported on 10/19/2021) 3350 g 1   No facility-administered medications prior to visit.     ROS Review of Systems  Constitutional:  Negative for activity change and appetite change.  HENT:  Negative for sinus pressure and sore throat.   Respiratory:  Negative for chest tightness, shortness of breath and wheezing.   Cardiovascular:  Negative for chest pain and palpitations.  Gastrointestinal:  Negative for abdominal distention, abdominal pain and constipation.  Genitourinary: Negative.   Musculoskeletal: Negative.   Psychiatric/Behavioral:  Negative for behavioral problems and dysphoric mood.     Objective:  BP 100/61 (BP Location: Left Arm, Patient Position: Sitting, Cuff Size: Normal)   Pulse (!) 56   Ht 5\' 3"  (1.6 m)   Wt 125 lb 9.6 oz (57 kg)   LMP 02/01/2009 (Approximate)   SpO2 99%   BMI 22.25 kg/m      10/19/2021    9:16 AM 07/09/2021   10:56 AM 04/16/2021    4:27 PM  BP/Weight  Systolic BP 100 113 125  Diastolic BP 61 63 77  Wt. (Lbs) 125.6 130.4 132  BMI 22.25 kg/m2 23.1 kg/m2 23.38 kg/m2      Physical Exam Constitutional:      Appearance: She is well-developed.  Cardiovascular:     Rate and Rhythm: Normal rate.     Heart sounds: Normal heart sounds. No murmur heard. Pulmonary:     Effort: Pulmonary effort is  normal.     Breath sounds: Normal breath sounds. No wheezing or rales.  Chest:     Chest wall: No tenderness.  Abdominal:     General: Bowel sounds are normal. There is no distension.     Palpations: Abdomen is soft. There is no mass.     Tenderness: There is no abdominal tenderness.  Musculoskeletal:        General: Normal range of motion.     Right lower leg: No edema.     Left lower leg: No edema.  Neurological:     Mental Status: She is alert and oriented to person, place, and time.  Psychiatric:        Mood and Affect: Mood normal.  Latest Ref Rng & Units 06/16/2020   10:49 AM 02/07/2020   10:20 AM 10/30/2018    9:28 AM  CMP  Glucose 65 - 99 mg/dL 97  85  98   BUN 8 - 27 mg/dL 10  16  16    Creatinine 0.57 - 1.00 mg/dL 0.79  0.77  0.92   Sodium 134 - 144 mmol/L 141  138  142   Potassium 3.5 - 5.2 mmol/L 4.2  4.2  4.0   Chloride 96 - 106 mmol/L 103  102  105   CO2 20 - 29 mmol/L 23  22  24    Calcium 8.7 - 10.3 mg/dL 9.4  8.9  9.2   Total Protein 6.0 - 8.5 g/dL 7.5     Total Bilirubin 0.0 - 1.2 mg/dL 0.4     Alkaline Phos 44 - 121 IU/L 87     AST 0 - 40 IU/L 20     ALT 0 - 32 IU/L 5       Lipid Panel     Component Value Date/Time   CHOL 184 02/07/2020 1020   TRIG 75 02/07/2020 1020   HDL 53 02/07/2020 1020   CHOLHDL 3.5 02/07/2020 1020   CHOLHDL 3.4 10/30/2015 1004   VLDL 15 10/30/2015 1004   LDLCALC 117 (H) 02/07/2020 1020    CBC    Component Value Date/Time   WBC 6.2 06/16/2020 1049   WBC 6.3 10/30/2015 1004   RBC 4.95 06/16/2020 1049   RBC 4.69 10/30/2015 1004   HGB 14.2 06/16/2020 1049   HCT 42.3 06/16/2020 1049   PLT 243 06/16/2020 1049   MCV 86 06/16/2020 1049   MCH 28.7 06/16/2020 1049   MCH 29.0 10/30/2015 1004   MCHC 33.6 06/16/2020 1049   MCHC 34.1 10/30/2015 1004   RDW 12.9 06/16/2020 1049   LYMPHSABS 2.9 06/16/2020 1049   MONOABS 504 10/30/2015 1004   EOSABS 0.2 06/16/2020 1049   BASOSABS 0.0 06/16/2020 1049    Lab Results   Component Value Date   HGBA1C 5.6 10/30/2018    Lab Results  Component Value Date   TSH 2.250 04/17/2021    Assessment & Plan:  1. Vertigo Discussed pathophysiology of vertigo Soft blood pressure is also a possibility and she has been advised to stay hydrated Her infrequent use of Elavil makes this is less likely possibility.  If symptoms persist consider discontinuing Elavil. - meclizine (ANTIVERT) 25 MG tablet; Take 1 tablet (25 mg total) by mouth 2 (two) times daily as needed for dizziness.  Dispense: 60 tablet; Refill: 1 - CBC with Differential/Platelet  2. Screening for diabetes mellitus - Hemoglobin A1c  3. Hypothyroidism (acquired) Controlled We will send thyroid labs and adjust regimen accordingly - T4, free - TSH - T3 - LP+Non-HDL Cholesterol  4. Vitamin D deficiency Completed course of Drisdol - VITAMIN D 25 Hydroxy (Vit-D Deficiency, Fractures)  5. Need for immunization against influenza - Flu Vaccine QUAD 58mo+IM (Fluarix, Fluzone & Alfiuria Quad PF)    No orders of the defined types were placed in this encounter.   Follow-up: Return in about 6 months (around 04/19/2022) for Chronic medical conditions.       Charlott Rakes, MD, FAAFP. [facility] [place], [place]   10/19/2021, 9:44 AM

## 2021-10-19 NOTE — Patient Instructions (Signed)
Vrtigo Vertigo El vrtigo es la sensacin de que usted o todo lo que lo rodea se mueve cuando en realidad eso no sucede. Esta sensacin puede aparecer y desaparecer en cualquier momento. El vrtigo suele desaparecer solo. El vrtigo puede ser peligroso si ocurre mientras est haciendo algo que podra suponer un riesgo para usted y para los dems, por [place], conduciendo un automvil u Airline pilot. Su mdico le har estudios para tratar de Office manager causa del vrtigo. Los estudios tambin ayudarn al mdico a decidir cul es la mejor manera de tratar su afeccin. Siga estas instrucciones en su casa: Comida y bebida     La deshidratacin puede empeorar el vrtigo. Beba suficiente lquido como para Theatre manager la orina de color amarillo plido. No beba alcohol. Actividad Retome sus actividades normales segn lo indicado por el mdico. Pregntele al mdico qu actividades son seguras para usted. Por la maana, sintese primero a un lado de la cama. Cuando se sienta bien, pngase lentamente de pie mientras se sostiene de algo, hasta que sepa que ha logrado el equilibrio. Muvase lentamente. Evite algunas posiciones o determinados movimientos repentinos de la cabeza y el cuerpo como se lo haya indicado el mdico. Si tiene dificultad para caminar o Theatre manager el equilibrio, use un bastn para Consulting civil engineer estabilidad. Si se siente mareado o inestable, sintese de inmediato. No haga ninguna tarea que podra ponerlo en riesgo a usted o a Geographical information systems officer de tener vrtigo. Evite agacharse si se siente mareado. En su casa, coloque los objetos de modo que le resulte fcil alcanzarlos sin doblarse o agacharse. No conduzca vehculos ni opere maquinaria si se siente mareado. Instrucciones generales Use los medicamentos de venta libre y los recetados solamente como se lo haya indicado el mdico. Concurra a todas las visitas de seguimiento. Esto es importante. Comunquese con un mdico si: Los  medicamentos no le alivian el vrtigo o este [place]. Su afeccin empeora o presenta sntomas nuevos. Tiene fiebre. Siente nuseas o tiene vmitos, o si las nuseas empeoran. Sus familiares o amigos advierten cambios en su comportamiento. Tiene adormecimiento o sensacin de pinchazos y hormigueo en una parte del cuerpo. Busque ayuda de inmediato si: Se siente mareado continuamente o se desmaya. Presenta dolores de cabeza intensos. Presenta rigidez en el cuello. Presenta sensibilidad a la luz. Tiene dificultad para moverse o hablar. Tiene debilidad [employer], los brazos o las piernas. Presenta cambios en la audicin o la visin. Estos sntomas pueden representar un problema grave que constituye Engineer, maintenance (IT). No espere a ver si los sntomas desaparecen. Solicite atencin mdica de inmediato. Comunquese con el servicio de emergencias de su localidad (911 en los [place]). No conduzca por sus propios medios Principal Financial. Resumen El vrtigo es la sensacin de que usted o todo lo que lo rodea se mueve cuando en realidad eso no sucede. Su mdico le har estudios para tratar de Office manager causa del vrtigo. Siga las instrucciones de Art therapist. Tal vez le indiquen que evite ciertas tareas, posiciones o movimientos. Comunquese con un mdico si los medicamentos no [employer] o si tiene fiebre, nuseas, vmitos o cambios en el comportamiento. Solicite ayuda de inmediato si tiene dolores de cabeza intensos o dificultad para hablar, o si experimenta problemas auditivos o de visin. Esta informacin no tiene Marine scientist el consejo del mdico. Asegrese de hacerle al mdico cualquier pregunta que tenga. Document Revised: 01/14/2020 Document Reviewed: 01/14/2020 Elsevier Patient Education  [place].

## 2021-10-20 ENCOUNTER — Other Ambulatory Visit: Payer: Self-pay | Admitting: Family Medicine

## 2021-10-20 DIAGNOSIS — E559 Vitamin D deficiency, unspecified: Secondary | ICD-10-CM

## 2021-10-20 DIAGNOSIS — E038 Other specified hypothyroidism: Secondary | ICD-10-CM

## 2021-10-20 LAB — CBC WITH DIFFERENTIAL/PLATELET
Basophils Absolute: 0 10*3/uL (ref 0.0–0.2)
Basos: 1 %
EOS (ABSOLUTE): 0.1 10*3/uL (ref 0.0–0.4)
Eos: 2 %
Hematocrit: 41.4 % (ref 34.0–46.6)
Hemoglobin: 14 g/dL (ref 11.1–15.9)
Immature Grans (Abs): 0 10*3/uL (ref 0.0–0.1)
Immature Granulocytes: 0 %
Lymphocytes Absolute: 2.6 10*3/uL (ref 0.7–3.1)
Lymphs: 43 %
MCH: 28.9 pg (ref 26.6–33.0)
MCHC: 33.8 g/dL (ref 31.5–35.7)
MCV: 86 fL (ref 79–97)
Monocytes Absolute: 0.4 10*3/uL (ref 0.1–0.9)
Monocytes: 7 %
Neutrophils Absolute: 2.9 10*3/uL (ref 1.4–7.0)
Neutrophils: 47 %
Platelets: 269 10*3/uL (ref 150–450)
RBC: 4.84 x10E6/uL (ref 3.77–5.28)
RDW: 12.9 % (ref 11.7–15.4)
WBC: 6.1 10*3/uL (ref 3.4–10.8)

## 2021-10-20 LAB — HEMOGLOBIN A1C
Est. average glucose Bld gHb Est-mCnc: 120 mg/dL
Hgb A1c MFr Bld: 5.8 % — ABNORMAL HIGH (ref 4.8–5.6)

## 2021-10-20 LAB — LP+NON-HDL CHOLESTEROL
Cholesterol, Total: 178 mg/dL (ref 100–199)
HDL: 53 mg/dL (ref >39)
LDL Chol Calc (NIH): 111 mg/dL — ABNORMAL HIGH (ref 0–99)
Total Non-HDL-Chol (LDL+VLDL): 125 mg/dL (ref 0–129)
Triglycerides: 72 mg/dL (ref 0–149)
VLDL Cholesterol Cal: 14 mg/dL (ref 5–40)

## 2021-10-20 LAB — T3: T3, Total: 110 ng/dL (ref 71–180)

## 2021-10-20 LAB — TSH: TSH: 1.92 u[IU]/mL (ref 0.450–4.500)

## 2021-10-20 LAB — T4, FREE: Free T4: 1.69 ng/dL (ref 0.82–1.77)

## 2021-10-20 LAB — VITAMIN D 25 HYDROXY (VIT D DEFICIENCY, FRACTURES): Vit D, 25-Hydroxy: 21.7 ng/mL — ABNORMAL LOW (ref 30.0–100.0)

## 2021-10-20 MED ORDER — LEVOTHYROXINE SODIUM 75 MCG PO TABS: ORAL_TABLET | ORAL | 6 refills | Status: AC

## 2021-10-20 MED ORDER — ERGOCALCIFEROL 1.25 MG (50000 UT) PO CAPS: 50000.0000 [IU] | ORAL_CAPSULE | ORAL | 1 refills | Status: DC

## 2021-12-07 ENCOUNTER — Telehealth: Payer: Self-pay | Admitting: Family Medicine

## 2021-12-07 DIAGNOSIS — E039 Hypothyroidism, unspecified: Secondary | ICD-10-CM

## 2021-12-07 NOTE — Telephone Encounter (Signed)
Call placed. Spoke to Rph and verified it is okay to change NDC. Recommend patient to return in 4-6 weeks to verify no change in labs. Patient informed of this. I have pended a thyroid panel to this thread. No questions or concerns at this time. Will route information to patient's PCP.

## 2021-12-07 NOTE — Telephone Encounter (Signed)
Handled in telephone encounter today by Lurena Joiner, [facility].

## 2021-12-07 NOTE — Telephone Encounter (Signed)
Lorie w/ [facility] pharmacy states they have a a manufacture change in the levothyroxine (SYNTHROID) 75 MCG tablet  (Accord is new manu) It that ok to dispense?  [facility] # [place], [place]

## 2021-12-07 NOTE — Telephone Encounter (Signed)
There seems to be an issue with Pt getting her refill for her levothyroxine (SYNTHROID) 75 MCG tablet / Freida Busman called but is not sure of the issue / he just advised they told him to call pcp office

## 2022-01-04 ENCOUNTER — Ambulatory Visit: Payer: Commercial Managed Care - HMO | Attending: Family Medicine

## 2022-01-04 DIAGNOSIS — E039 Hypothyroidism, unspecified: Secondary | ICD-10-CM

## 2022-01-05 LAB — THYROID PANEL WITH TSH
Free Thyroxine Index: 2.4 (ref 1.2–4.9)
T3 Uptake Ratio: 27 % (ref 24–39)
T4, Total: 8.9 ug/dL (ref 4.5–12.0)
TSH: 3.18 u[IU]/mL (ref 0.450–4.500)

## 2022-03-10 ENCOUNTER — Encounter (HOSPITAL_COMMUNITY): Payer: Self-pay

## 2022-03-10 ENCOUNTER — Ambulatory Visit (HOSPITAL_COMMUNITY)
Admission: EM | Admit: 2022-03-10 | Discharge: 2022-03-10 | Disposition: A | Discharge status: Home / Self Care | Payer: PRIVATE HEALTH INSURANCE | Attending: Internal Medicine | Admitting: Internal Medicine

## 2022-03-10 DIAGNOSIS — Z1152 Encounter for screening for COVID-19: Secondary | ICD-10-CM | POA: Diagnosis not present

## 2022-03-10 DIAGNOSIS — J09X2 Influenza due to identified novel influenza A virus with other respiratory manifestations: Secondary | ICD-10-CM | POA: Insufficient documentation

## 2022-03-10 DIAGNOSIS — E039 Hypothyroidism, unspecified: Secondary | ICD-10-CM | POA: Insufficient documentation

## 2022-03-10 LAB — POC INFLUENZA A AND B ANTIGEN (URGENT CARE ONLY)
INFLUENZA A ANTIGEN, POC: POSITIVE — AB
INFLUENZA B ANTIGEN, POC: NEGATIVE

## 2022-03-10 MED ORDER — BENZONATATE 100 MG PO CAPS: 100.0000 mg | ORAL_CAPSULE | Freq: Three times a day (TID) | ORAL | 0 refills | Status: AC

## 2022-03-10 MED ORDER — OSELTAMIVIR PHOSPHATE 75 MG PO CAPS: 75.0000 mg | ORAL_CAPSULE | Freq: Two times a day (BID) | ORAL | 0 refills | Status: AC

## 2022-03-10 NOTE — ED Provider Notes (Signed)
[place]   [id number] 03/10/22 Arrival Time: 1132  ASSESSMENT & PLAN:  1. Influenza due to identified novel influenza A virus with other respiratory manifestations    -History and exam concerning for viral illness.  Her COVID test is pending.  Flu test is positive today.  Since her symptoms have acutely worsened in the last 24-48 hours I will treat her with Tamiflu.  She was counseled on hand hygiene to prevent the spread of the virus.  Also encouraged her to get some rest and maintain adequate p.o. hydration.  She voiced understanding.  All questions were answered and she agrees to the plan.  Meds ordered this encounter  Medications   benzonatate (TESSALON) 100 MG capsule    Sig: Take 1 capsule (100 mg total) by mouth every 8 (eight) hours.    Dispense:  21 capsule    Refill:  0   oseltamivir (TAMIFLU) 75 MG capsule    Sig: Take 1 capsule (75 mg total) by mouth every 12 (twelve) hours.    Dispense:  10 capsule    Refill:  0   Discharge Instructions   None       Reviewed expectations re: course of current medical issues. Questions answered. Outlined signs and symptoms indicating need for more acute intervention. Patient verbalized understanding. After Visit Summary given.   SUBJECTIVE: This patient interaction was aided by the use of a Spanish interpreter on the iPad. Pleasant 62 year old female comes to clinic to be evaluated for fevers, cough, sore throat.  Symptoms began Saturday.  Her symptoms acutely worsened about 2 days ago.  She says she had a fever yesterday.  She has had a dry cough that is nonproductive.  The cough is worse at night.  She has known exposure to sick children who live in her house.  She says they were diagnosed with a virus but was unsure what it was exactly.  Only medicine she has tried taking over-the-counter is Tylenol.  She denies chest pain, shortness of breath, nausea or vomiting.  Patient's last menstrual period was 02/01/2009  (approximate). Past Surgical History:  Procedure Laterality Date   CESAREAN SECTION  1997   COLONOSCOPY WITH PROPOFOL N/A 08/07/2013   Procedure: COLONOSCOPY WITH PROPOFOL;  Surgeon: Garlan Fair, MD;  Location: [facility];  Service: Endoscopy;  Laterality: N/A;     OBJECTIVE:  Vitals:   03/10/22 1158  BP: 102/68  Pulse: 79  Resp: (!) 98  Temp: 98.1 F (36.7 C)  TempSrc: Oral  SpO2: 98%     Physical Exam Vitals reviewed.  Constitutional:      General: She is not in acute distress. HENT:     Head: Normocephalic.     Right Ear: Tympanic membrane normal.     Left Ear: Tympanic membrane normal.     Nose: Congestion present.     Mouth/Throat:     Pharynx: Posterior oropharyngeal erythema present. No oropharyngeal exudate.  Cardiovascular:     Rate and Rhythm: Normal rate.  Pulmonary:     Effort: Pulmonary effort is normal.  Abdominal:     Palpations: Abdomen is soft.  Musculoskeletal:        General: Normal range of motion.  Lymphadenopathy:     Cervical: Cervical adenopathy present.  Neurological:     General: No focal deficit present.     Mental Status: She is alert.  Psychiatric:        Mood and Affect: Mood normal.      Labs:  Results for orders placed or performed during the hospital encounter of 03/10/22  POC Influenza A & B Ag (Urgent Care)  Result Value Ref Range   INFLUENZA A ANTIGEN, POC POSITIVE (A) NEGATIVE   INFLUENZA B ANTIGEN, POC NEGATIVE NEGATIVE   Labs Reviewed  POC INFLUENZA A AND B ANTIGEN (URGENT CARE ONLY) - Abnormal; Notable for the following components:      Result Value   INFLUENZA A ANTIGEN, POC POSITIVE (*)    All other components within normal limits  SARS CORONAVIRUS 2 (TAT 6-24 HRS)    Imaging: No results found.   Allergies  Allergen Reactions   Amoxicillin Nausea And Vomiting    Has patient had a PCN reaction causing immediate rash, facial/tongue/throat swelling, SOB or lightheadedness with hypotension: yes Has  patient had a PCN reaction causing severe rash involving mucus membranes or skin necrosis: no Has patient had a PCN reaction that required hospitalization : no Has patient had a PCN reaction occurring within the last 10 years: yes If all of the above answers are "NO", then may proceed with Cephalosporin use.    Penicillins Nausea And Vomiting    Has patient had a PCN reaction causing immediate rash, facial/tongue/throat swelling, SOB or lightheadedness with hypotension: no Has patient had a PCN reaction causing severe rash involving mucus membranes or skin necrosis: no Has patient had a PCN reaction that required hospitalization: no Has patient had a PCN reaction occurring within the last 10 years: no If all of the above answers are "NO", then may proceed with Cephalosporin use.                                                Past Medical History:  Diagnosis Date   Bruises easily for a long time   saw doctor, no problem found   Complication of anesthesia 1997   slow to awaken after c section due to dentures accidentally left in   Headache(784.0)    and nausea occasionally   Hypothyroidism    No pertinent past medical history    PT IS A POOR HISTORIAN    Thyroid disease     Social History   Socioeconomic History   Marital status: Single    Spouse name: Not on file   Number of children: Not on file   Years of education: Not on file   Highest education level: Not on file  Occupational History   Not on file  Tobacco Use   Smoking status: Never   Smokeless tobacco: Never  Substance and Sexual Activity   Alcohol use: No   Drug use: No   Sexual activity: Never    Birth control/protection: Abstinence  Other Topics Concern   Not on file  Social History Narrative   Not on file   Social Determinants of Health   Financial Resource Strain: Not on file  Food Insecurity: Not on file  Transportation Needs: Not on file  Physical Activity: Not on file  Stress: Not on file   Social Connections: Not on file  Intimate Partner Violence: Not on file    Family History  Problem Relation Age of Onset   Breast cancer Neg Hx       Mel Langan, Dorian Pod, MD 03/10/22 1327

## 2022-03-10 NOTE — ED Triage Notes (Signed)
Pt presents to [facility] with co fevers, and lightheadedness since Saturday. Pt reports sore throat and cough as well. Tylenol otc for symtpoms   Triage completed with [phone]

## 2022-03-11 LAB — SARS CORONAVIRUS 2 (TAT 6-24 HRS): SARS Coronavirus 2: NEGATIVE

## 2022-03-22 ENCOUNTER — Other Ambulatory Visit: Payer: Self-pay | Admitting: Family Medicine

## 2022-03-31 ENCOUNTER — Other Ambulatory Visit: Payer: Self-pay | Admitting: Physician Assistant

## 2022-03-31 DIAGNOSIS — Z1231 Encounter for screening mammogram for malignant neoplasm of breast: Secondary | ICD-10-CM

## 2022-04-16 ENCOUNTER — Other Ambulatory Visit: Payer: Self-pay | Admitting: Family Medicine

## 2022-04-16 DIAGNOSIS — E559 Vitamin D deficiency, unspecified: Secondary | ICD-10-CM

## 2022-04-19 ENCOUNTER — Ambulatory Visit: Payer: 59 | Admitting: Family Medicine

## 2022-05-03 ENCOUNTER — Ambulatory Visit
Admission: RE | Admit: 2022-05-03 | Discharge: 2022-05-03 | Disposition: A | Discharge status: Home / Self Care | Payer: PRIVATE HEALTH INSURANCE | Source: Ambulatory Visit | Attending: Physician Assistant | Admitting: Physician Assistant

## 2022-05-03 DIAGNOSIS — Z1231 Encounter for screening mammogram for malignant neoplasm of breast: Secondary | ICD-10-CM

## 2022-06-18 ENCOUNTER — Telehealth: Payer: Self-pay

## 2022-06-18 NOTE — Telephone Encounter (Signed)
   Telephone encounter was:  Unsuccessful.  06/18/2022 Name: Audrey Schwartz MRN: [id number] DOB: [birth year]  Unsuccessful outbound call made today to assist with:   Colorectal Cancer Screening  Outreach Attempt:  1st Attempt  A HIPAA compliant voice message was left requesting a return call.  Instructed patient to call back    Lenard Forth [facility] Guide, [facility] [phone] [address] [place], [place], [place] [address] Phone: [phone] Email: [email or web address]

## 2022-06-21 ENCOUNTER — Telehealth: Payer: Self-pay

## 2022-06-21 NOTE — Telephone Encounter (Signed)
   Telephone encounter was:  Unsuccessful.  06/21/2022 Name: Audrey Schwartz MRN: [id number] DOB: Jan 05, 1961  Unsuccessful outbound call made today to assist with:   Colorectal Cancer Screening  Outreach Attempt:  2nd Attempt  A HIPAA compliant voice message was left requesting a return call.  Instructed patient to call back .    Lenard Forth [facility] Guide, [facility] [phone] [address] [place], [place], [place] [address] Phone: [phone] Email: [email or web address]

## 2022-06-25 ENCOUNTER — Telehealth: Payer: Self-pay

## 2022-06-25 NOTE — Telephone Encounter (Signed)
   Telephone encounter was:  Unsuccessful.  06/25/2022 Name: Audrey Schwartz MRN: [id number] DOB: [birth year]  Unsuccessful outbound call made today to assist with:  Colorectal Cancer Screening  Outreach Attempt:  3rd Attempt.  Referral closed unable to contact patient.  A HIPAA compliant voice message was left requesting a return call.  Instructed patient to call back    Lenard Forth [facility] Guide, [facility] [phone] [address] [place], [place], [place] [address] Phone: [phone] Email: [email or web address]

## 2023-11-28 ENCOUNTER — Other Ambulatory Visit: Payer: Self-pay | Admitting: Family Medicine

## 2023-11-28 DIAGNOSIS — G44229 Chronic tension-type headache, not intractable: Secondary | ICD-10-CM

## 2023-11-28 LAB — COLOGUARD: COLOGUARD: NEGATIVE

## 2023-11-28 NOTE — Telephone Encounter (Signed)
 Copied from CRM #[id number]. Topic: Clinical - Medication Refill >> Nov 28, 2023  3:11 PM Delon T wrote: Medication: amitriptyline  (ELAVIL ) 10 MG tablet  Has the patient contacted their pharmacy? Yes (Agent: If no, request that the patient contact the pharmacy for the refill. If patient does not wish to contact the pharmacy document the reason why and proceed with request.) (Agent: If yes, when and what did the pharmacy advise?)  This is the patient's preferred pharmacy:  [facility] # [address], [place] - [address] [place] [address] Phone: [phone] Fax: [phone]  Is this the correct pharmacy for this prescription? Yes If no, delete pharmacy and type the correct one.   Has the prescription been filled recently? Yes  Is the patient out of the medication? Yes  Has the patient been seen for an appointment in the last year OR does the patient have an upcoming appointment? Yes  Can we respond through MyChart? Yes  Agent: Please be advised that Rx refills may take up to 3 business days. We ask that you follow-up with your pharmacy.

## 2023-11-28 NOTE — Telephone Encounter (Unsigned)
 Copied from CRM #[id number]. Topic: Clinical - Medication Refill >> Nov 28, 2023  3:11 PM Delon DASEN wrote: Medication: amitriptyline  (ELAVIL ) 10 MG tablet  Has the patient contacted their pharmacy? Yes (Agent: If no, request that the patient contact the pharmacy for the refill. If patient does not wish to contact the pharmacy document the reason why and proceed with request.) (Agent: If yes, when and what did the pharmacy advise?)  This is the patient's preferred pharmacy:  [facility] # [address] [place] - [address] [place] [address] Phone: [phone] Fax: [phone]  Is this the correct pharmacy for this prescription? Yes If no, delete pharmacy and type the correct one.   Has the prescription been filled recently? Yes  Is the patient out of the medication? Yes  Has the patient been seen for an appointment in the last year OR does the patient have an upcoming appointment? {yes/no:20286}  Can we respond through MyChart? {yes/no:20286}  Agent: Please be advised that Rx refills may take up to 3 business days. We ask that you follow-up with your pharmacy.

## 2023-11-30 NOTE — Telephone Encounter (Signed)
 Requested medication (s) are due for refill today: yes  Requested medication (s) are on the active medication list: yes  Last refill:  04/16/21  Future visit scheduled: no  Notes to clinic:  Unable to refill per protocol, last refill by another provider.      Requested Prescriptions  Pending Prescriptions Disp Refills   amitriptyline  (ELAVIL ) 10 MG tablet 90 tablet 1    Sig: Take 1 tablet (10 mg total) by mouth at bedtime.     Psychiatry:  Antidepressants - Heterocyclics (TCAs) Failed - 11/30/2023 10:28 AM      Failed - Valid encounter within last 6 months    Recent Outpatient Visits           2 years ago Vertigo   [facility] [place]. [facility] Delbert Clam, MD   2 years ago Dermatitis   [facility] - [facility] Dept Of [place]. [facility] Delbert Clam, MD   2 years ago Vitamin D  deficiency disease   [facility] - [facility] Dept Of [place]. [facility] [place], [place], [place]   2 years ago Chronic tension-type headache, not intractable   [facility] - [facility] Of [place]. [facility] Theotis Haze ORN, NP   3 years ago Other specified hypothyroidism   [facility] - [facility] Dept Of [place]. [facility] Theotis Haze ORN, [place]

## 2023-11-30 NOTE — Telephone Encounter (Signed)
 Requested medication (s) are due for refill today: routing for refill  Requested medication (s) are on the active medication list: yes  Last refill:  04/16/21  Future visit scheduled: no  Notes to clinic:  routing for review     Requested Prescriptions  Pending Prescriptions Disp Refills   amitriptyline  (ELAVIL ) 10 MG tablet 90 tablet 1    Sig: Take 1 tablet (10 mg total) by mouth at bedtime.     Psychiatry:  Antidepressants - Heterocyclics (TCAs) Failed - 11/30/2023  9:50 AM      Failed - Valid encounter within last 6 months    Recent Outpatient Visits           2 years ago Vertigo   [facility] Of [place]. [facility] Delbert Clam, MD   2 years ago Dermatitis   [facility] - [facility] Dept Of [place]. [facility] Delbert Clam, MD   2 years ago Vitamin D  deficiency disease   [facility] - [facility] Dept Of [place]. [facility] [place], [place], [place]   2 years ago Chronic tension-type headache, not intractable   [facility] - A Dept Of [place]. [facility] Theotis Haze ORN, NP   3 years ago Other specified hypothyroidism   [facility] - [facility] Dept Of [place]. [facility] Theotis Haze ORN, [place]
